# Patient Record
Sex: Male | Born: 1935 | Hispanic: Yes | Smoking: Former smoker
Health system: Southern US, Community
[De-identification: ages and names within clinical notes are randomized; demographics above are authoritative.]

## PROBLEM LIST (undated history)

## (undated) DIAGNOSIS — J9610 Chronic respiratory failure, unspecified whether with hypoxia or hypercapnia: Secondary | ICD-10-CM

## (undated) DIAGNOSIS — M1990 Unspecified osteoarthritis, unspecified site: Secondary | ICD-10-CM

## (undated) DIAGNOSIS — R05 Cough: Secondary | ICD-10-CM

## (undated) DIAGNOSIS — E119 Type 2 diabetes mellitus without complications: Secondary | ICD-10-CM

## (undated) DIAGNOSIS — K5900 Constipation, unspecified: Secondary | ICD-10-CM

## (undated) DIAGNOSIS — J8410 Pulmonary fibrosis, unspecified: Secondary | ICD-10-CM

## (undated) DIAGNOSIS — R059 Cough, unspecified: Secondary | ICD-10-CM

## (undated) DIAGNOSIS — A539 Syphilis, unspecified: Secondary | ICD-10-CM

## (undated) DIAGNOSIS — F0280 Dementia in other diseases classified elsewhere without behavioral disturbance: Secondary | ICD-10-CM

## (undated) DIAGNOSIS — I10 Essential (primary) hypertension: Secondary | ICD-10-CM

## (undated) DIAGNOSIS — J449 Chronic obstructive pulmonary disease, unspecified: Secondary | ICD-10-CM

## (undated) DIAGNOSIS — J189 Pneumonia, unspecified organism: Secondary | ICD-10-CM

## (undated) DIAGNOSIS — G309 Alzheimer's disease, unspecified: Secondary | ICD-10-CM

## (undated) HISTORY — PX: CATARACT EXTRACTION: SUR2

---

## 2015-11-27 ENCOUNTER — Other Ambulatory Visit: Payer: Self-pay

## 2015-11-27 ENCOUNTER — Inpatient Hospital Stay (HOSPITAL_COMMUNITY)
Admission: EM | Admit: 2015-11-27 | Discharge: 2015-11-30 | DRG: 871 | Disposition: A | Discharge status: Skilled Nursing Facility | Payer: Medicare Other | Attending: Internal Medicine | Admitting: Internal Medicine

## 2015-11-27 ENCOUNTER — Encounter (HOSPITAL_COMMUNITY): Payer: Self-pay | Admitting: Emergency Medicine

## 2015-11-27 ENCOUNTER — Emergency Department (HOSPITAL_COMMUNITY): Payer: Medicare Other

## 2015-11-27 DIAGNOSIS — M1990 Unspecified osteoarthritis, unspecified site: Secondary | ICD-10-CM | POA: Diagnosis present

## 2015-11-27 DIAGNOSIS — E43 Unspecified severe protein-calorie malnutrition: Secondary | ICD-10-CM | POA: Diagnosis present

## 2015-11-27 DIAGNOSIS — L8990 Pressure ulcer of unspecified site, unspecified stage: Secondary | ICD-10-CM | POA: Diagnosis present

## 2015-11-27 DIAGNOSIS — L89151 Pressure ulcer of sacral region, stage 1: Secondary | ICD-10-CM | POA: Diagnosis present

## 2015-11-27 DIAGNOSIS — I129 Hypertensive chronic kidney disease with stage 1 through stage 4 chronic kidney disease, or unspecified chronic kidney disease: Secondary | ICD-10-CM | POA: Diagnosis present

## 2015-11-27 DIAGNOSIS — G309 Alzheimer's disease, unspecified: Secondary | ICD-10-CM | POA: Diagnosis present

## 2015-11-27 DIAGNOSIS — J189 Pneumonia, unspecified organism: Secondary | ICD-10-CM | POA: Diagnosis present

## 2015-11-27 DIAGNOSIS — N179 Acute kidney failure, unspecified: Secondary | ICD-10-CM | POA: Diagnosis present

## 2015-11-27 DIAGNOSIS — R627 Adult failure to thrive: Secondary | ICD-10-CM | POA: Diagnosis present

## 2015-11-27 DIAGNOSIS — R778 Other specified abnormalities of plasma proteins: Secondary | ICD-10-CM | POA: Diagnosis present

## 2015-11-27 DIAGNOSIS — F0280 Dementia in other diseases classified elsewhere without behavioral disturbance: Secondary | ICD-10-CM | POA: Diagnosis present

## 2015-11-27 DIAGNOSIS — J440 Chronic obstructive pulmonary disease with acute lower respiratory infection: Secondary | ICD-10-CM | POA: Diagnosis present

## 2015-11-27 DIAGNOSIS — R4182 Altered mental status, unspecified: Secondary | ICD-10-CM | POA: Diagnosis present

## 2015-11-27 DIAGNOSIS — J9611 Chronic respiratory failure with hypoxia: Secondary | ICD-10-CM | POA: Diagnosis present

## 2015-11-27 DIAGNOSIS — Z7401 Bed confinement status: Secondary | ICD-10-CM | POA: Diagnosis not present

## 2015-11-27 DIAGNOSIS — R7989 Other specified abnormal findings of blood chemistry: Secondary | ICD-10-CM | POA: Diagnosis present

## 2015-11-27 DIAGNOSIS — Z9981 Dependence on supplemental oxygen: Secondary | ICD-10-CM

## 2015-11-27 DIAGNOSIS — Y95 Nosocomial condition: Secondary | ICD-10-CM | POA: Diagnosis present

## 2015-11-27 DIAGNOSIS — J9621 Acute and chronic respiratory failure with hypoxia: Secondary | ICD-10-CM | POA: Diagnosis present

## 2015-11-27 DIAGNOSIS — A419 Sepsis, unspecified organism: Principal | ICD-10-CM | POA: Diagnosis present

## 2015-11-27 DIAGNOSIS — Z7952 Long term (current) use of systemic steroids: Secondary | ICD-10-CM

## 2015-11-27 DIAGNOSIS — Z794 Long term (current) use of insulin: Secondary | ICD-10-CM

## 2015-11-27 DIAGNOSIS — R0602 Shortness of breath: Secondary | ICD-10-CM

## 2015-11-27 DIAGNOSIS — E119 Type 2 diabetes mellitus without complications: Secondary | ICD-10-CM

## 2015-11-27 DIAGNOSIS — E1310 Other specified diabetes mellitus with ketoacidosis without coma: Secondary | ICD-10-CM | POA: Diagnosis present

## 2015-11-27 DIAGNOSIS — J441 Chronic obstructive pulmonary disease with (acute) exacerbation: Secondary | ICD-10-CM | POA: Diagnosis present

## 2015-11-27 DIAGNOSIS — E872 Acidosis, unspecified: Secondary | ICD-10-CM

## 2015-11-27 DIAGNOSIS — R6520 Severe sepsis without septic shock: Secondary | ICD-10-CM | POA: Diagnosis present

## 2015-11-27 DIAGNOSIS — R0600 Dyspnea, unspecified: Secondary | ICD-10-CM | POA: Diagnosis not present

## 2015-11-27 DIAGNOSIS — I248 Other forms of acute ischemic heart disease: Secondary | ICD-10-CM | POA: Diagnosis present

## 2015-11-27 DIAGNOSIS — J449 Chronic obstructive pulmonary disease, unspecified: Secondary | ICD-10-CM | POA: Diagnosis not present

## 2015-11-27 DIAGNOSIS — J8410 Pulmonary fibrosis, unspecified: Secondary | ICD-10-CM | POA: Diagnosis present

## 2015-11-27 DIAGNOSIS — I10 Essential (primary) hypertension: Secondary | ICD-10-CM | POA: Diagnosis present

## 2015-11-27 HISTORY — DX: Type 2 diabetes mellitus without complications: E11.9

## 2015-11-27 HISTORY — DX: Essential (primary) hypertension: I10

## 2015-11-27 HISTORY — DX: Constipation, unspecified: K59.00

## 2015-11-27 HISTORY — DX: Dementia in other diseases classified elsewhere, unspecified severity, without behavioral disturbance, psychotic disturbance, mood disturbance, and anxiety: F02.80

## 2015-11-27 HISTORY — DX: Syphilis, unspecified: A53.9

## 2015-11-27 HISTORY — DX: Cough, unspecified: R05.9

## 2015-11-27 HISTORY — DX: Cough: R05

## 2015-11-27 HISTORY — DX: Unspecified osteoarthritis, unspecified site: M19.90

## 2015-11-27 HISTORY — DX: Chronic obstructive pulmonary disease, unspecified: J44.9

## 2015-11-27 HISTORY — DX: Alzheimer's disease, unspecified: G30.9

## 2015-11-27 HISTORY — DX: Pulmonary fibrosis, unspecified: J84.10

## 2015-11-27 LAB — URINALYSIS, ROUTINE W REFLEX MICROSCOPIC
Bilirubin Urine: NEGATIVE
Glucose, UA: NEGATIVE mg/dL
Ketones, ur: NEGATIVE mg/dL
LEUKOCYTES UA: NEGATIVE
NITRITE: NEGATIVE
PROTEIN: 30 mg/dL — AB
SPECIFIC GRAVITY, URINE: 1.016 (ref 1.005–1.030)
pH: 5.5 (ref 5.0–8.0)

## 2015-11-27 LAB — COMPREHENSIVE METABOLIC PANEL
ALT: 27 U/L (ref 17–63)
AST: 43 U/L — AB (ref 15–41)
Albumin: 2.3 g/dL — ABNORMAL LOW (ref 3.5–5.0)
Alkaline Phosphatase: 96 U/L (ref 38–126)
Anion gap: 15 (ref 5–15)
BILIRUBIN TOTAL: 0.7 mg/dL (ref 0.3–1.2)
BUN: 53 mg/dL — AB (ref 6–20)
CHLORIDE: 102 mmol/L (ref 101–111)
CO2: 20 mmol/L — ABNORMAL LOW (ref 22–32)
CREATININE: 3.95 mg/dL — AB (ref 0.61–1.24)
Calcium: 8.8 mg/dL — ABNORMAL LOW (ref 8.9–10.3)
GFR calc Af Amer: 15 mL/min — ABNORMAL LOW (ref >60)
GFR, EST NON AFRICAN AMERICAN: 13 mL/min — AB (ref >60)
GLUCOSE: 302 mg/dL — AB (ref 65–99)
Potassium: 4 mmol/L (ref 3.5–5.1)
Sodium: 137 mmol/L (ref 135–145)
TOTAL PROTEIN: 7.6 g/dL (ref 6.5–8.1)

## 2015-11-27 LAB — I-STAT TROPONIN, ED: Troponin i, poc: 0.28 ng/mL (ref 0.00–0.08)

## 2015-11-27 LAB — URINE MICROSCOPIC-ADD ON

## 2015-11-27 LAB — CBC WITH DIFFERENTIAL/PLATELET
Basophils Absolute: 0.2 10*3/uL — ABNORMAL HIGH (ref 0.0–0.1)
Basophils Relative: 1 %
EOS PCT: 0 %
Eosinophils Absolute: 0 10*3/uL (ref 0.0–0.7)
HCT: 39.2 % (ref 39.0–52.0)
Hemoglobin: 12.6 g/dL — ABNORMAL LOW (ref 13.0–17.0)
LYMPHS ABS: 2 10*3/uL (ref 0.7–4.0)
Lymphocytes Relative: 11 %
MCH: 26.8 pg (ref 26.0–34.0)
MCHC: 32.1 g/dL (ref 30.0–36.0)
MCV: 83.4 fL (ref 78.0–100.0)
MONO ABS: 0.7 10*3/uL (ref 0.1–1.0)
Monocytes Relative: 4 %
NEUTROS ABS: 15 10*3/uL — AB (ref 1.7–7.7)
Neutrophils Relative %: 84 %
PLATELETS: 280 10*3/uL (ref 150–400)
RBC: 4.7 MIL/uL (ref 4.22–5.81)
RDW: 13.2 % (ref 11.5–15.5)
WBC: 17.9 10*3/uL — AB (ref 4.0–10.5)

## 2015-11-27 LAB — AMMONIA: AMMONIA: 29 umol/L (ref 9–35)

## 2015-11-27 LAB — I-STAT CG4 LACTIC ACID, ED
Lactic Acid, Venous: 6.09 mmol/L (ref 0.5–2.0)
Lactic Acid, Venous: 3.12 mmol/L (ref 0.5–2.0)

## 2015-11-27 LAB — I-STAT VENOUS BLOOD GAS, ED
ACID-BASE DEFICIT: 6 mmol/L — AB (ref 0.0–2.0)
BICARBONATE: 19.2 meq/L — AB (ref 20.0–24.0)
O2 SAT: 57 %
TCO2: 20 mmol/L (ref 0–100)
pCO2, Ven: 34.7 mmHg — ABNORMAL LOW (ref 45.0–50.0)
pH, Ven: 7.35 — ABNORMAL HIGH (ref 7.250–7.300)
pO2, Ven: 31 mmHg (ref 31.0–45.0)

## 2015-11-27 LAB — TROPONIN I: TROPONIN I: 0.3 ng/mL — AB (ref <0.031)

## 2015-11-27 LAB — LACTIC ACID, PLASMA: Lactic Acid, Venous: 2.2 mmol/L (ref 0.5–2.0)

## 2015-11-27 MED ORDER — SODIUM CHLORIDE 0.9 % IV SOLN
INTRAVENOUS | Status: DC
  Administered 2015-11-27 – 2015-11-28 (×2): via INTRAVENOUS

## 2015-11-27 MED ORDER — ALBUTEROL SULFATE (2.5 MG/3ML) 0.083% IN NEBU
2.5000 mg | INHALATION_SOLUTION | Freq: Four times a day (QID) | RESPIRATORY_TRACT | Status: DC
Start: 2015-11-27 — End: 2015-11-28
  Administered 2015-11-28 (×3): 2.5 mg via RESPIRATORY_TRACT
  Filled 2015-11-27 (×2): qty 3

## 2015-11-27 MED ORDER — AZITHROMYCIN 500 MG IV SOLR
250.0000 mg | INTRAVENOUS | Status: DC
  Administered 2015-11-28 (×2): 250 mg via INTRAVENOUS
  Filled 2015-11-27 (×2): qty 250

## 2015-11-27 MED ORDER — INSULIN ASPART 100 UNIT/ML ~~LOC~~ SOLN
0.0000 [IU] | Freq: Three times a day (TID) | SUBCUTANEOUS | Status: DC
  Administered 2015-11-28: 3 [IU] via SUBCUTANEOUS
  Administered 2015-11-28: 5 [IU] via SUBCUTANEOUS

## 2015-11-27 MED ORDER — VANCOMYCIN HCL IN DEXTROSE 1-5 GM/200ML-% IV SOLN
1000.0000 mg | Freq: Once | INTRAVENOUS | Status: AC
  Administered 2015-11-27: 1000 mg via INTRAVENOUS
  Filled 2015-11-27: qty 200

## 2015-11-27 MED ORDER — BUDESONIDE 0.5 MG/2ML IN SUSP
0.5000 mg | Freq: Two times a day (BID) | RESPIRATORY_TRACT | Status: DC
  Administered 2015-11-28 – 2015-11-30 (×5): 0.5 mg via RESPIRATORY_TRACT
  Filled 2015-11-27 (×6): qty 2

## 2015-11-27 MED ORDER — ALBUTEROL SULFATE (2.5 MG/3ML) 0.083% IN NEBU: 2.5000 mg | INHALATION_SOLUTION | RESPIRATORY_TRACT | Status: DC | PRN

## 2015-11-27 MED ORDER — INSULIN ASPART 100 UNIT/ML ~~LOC~~ SOLN
0.0000 [IU] | Freq: Every day | SUBCUTANEOUS | Status: DC
Start: 2015-11-27 — End: 2015-11-28
  Administered 2015-11-28: 4 [IU] via SUBCUTANEOUS

## 2015-11-27 MED ORDER — DEXTROSE 5 % IV SOLN
1.0000 g | INTRAVENOUS | Status: DC
  Administered 2015-11-28 – 2015-11-29 (×2): 1 g via INTRAVENOUS
  Filled 2015-11-27 (×2): qty 1

## 2015-11-27 MED ORDER — ASPIRIN 300 MG RE SUPP: 300.0000 mg | Freq: Once | RECTAL | Status: DC

## 2015-11-27 MED ORDER — VANCOMYCIN HCL IN DEXTROSE 750-5 MG/150ML-% IV SOLN: 750.0000 mg | INTRAVENOUS | Status: DC

## 2015-11-27 MED ORDER — PREDNISONE 5 MG PO TABS
15.0000 mg | ORAL_TABLET | Freq: Every day | ORAL | Status: DC
  Administered 2015-11-28 – 2015-11-29 (×2): 15 mg via ORAL
  Filled 2015-11-27: qty 1
  Filled 2015-11-27: qty 2

## 2015-11-27 MED ORDER — SODIUM CHLORIDE 0.9% FLUSH
3.0000 mL | Freq: Two times a day (BID) | INTRAVENOUS | Status: DC
  Administered 2015-11-28 – 2015-11-30 (×6): 3 mL via INTRAVENOUS

## 2015-11-27 MED ORDER — SODIUM CHLORIDE 0.9 % IV BOLUS (SEPSIS): 1000.0000 mL | Freq: Once | INTRAVENOUS | Status: DC

## 2015-11-27 MED ORDER — ATORVASTATIN CALCIUM 40 MG PO TABS
40.0000 mg | ORAL_TABLET | Freq: Every day | ORAL | Status: DC
Start: 2015-11-28 — End: 2015-11-30
  Administered 2015-11-28 – 2015-11-30 (×3): 40 mg via ORAL
  Filled 2015-11-27 (×3): qty 1

## 2015-11-27 MED ORDER — PIPERACILLIN-TAZOBACTAM IN DEX 2-0.25 GM/50ML IV SOLN
2.2500 g | Freq: Three times a day (TID) | INTRAVENOUS | Status: DC
  Filled 2015-11-27: qty 50

## 2015-11-27 MED ORDER — ACETAMINOPHEN 650 MG RE SUPP: 650.0000 mg | Freq: Four times a day (QID) | RECTAL | Status: DC | PRN

## 2015-11-27 MED ORDER — SODIUM CHLORIDE 0.9 % IV BOLUS (SEPSIS)
30.0000 mL/kg | Freq: Once | INTRAVENOUS | Status: AC
  Administered 2015-11-27: 2100 mL via INTRAVENOUS

## 2015-11-27 MED ORDER — IPRATROPIUM-ALBUTEROL 0.5-2.5 (3) MG/3ML IN SOLN: 3.0000 mL | Freq: Four times a day (QID) | RESPIRATORY_TRACT | Status: DC

## 2015-11-27 MED ORDER — GLUCERNA SHAKE PO LIQD
237.0000 mL | Freq: Every evening | ORAL | Status: DC
  Administered 2015-11-28 – 2015-11-29 (×2): 237 mL via ORAL

## 2015-11-27 MED ORDER — ASPIRIN 81 MG PO CHEW
324.0000 mg | CHEWABLE_TABLET | Freq: Once | ORAL | Status: AC
  Administered 2015-11-27: 324 mg via ORAL
  Filled 2015-11-27: qty 4

## 2015-11-27 MED ORDER — PIPERACILLIN-TAZOBACTAM 3.375 G IVPB 30 MIN
3.3750 g | Freq: Once | INTRAVENOUS | Status: AC
  Administered 2015-11-27: 3.375 g via INTRAVENOUS
  Filled 2015-11-27: qty 50

## 2015-11-27 MED ORDER — ACETAMINOPHEN 325 MG PO TABS
650.0000 mg | ORAL_TABLET | Freq: Four times a day (QID) | ORAL | Status: DC | PRN
  Administered 2015-11-29: 650 mg via ORAL
  Filled 2015-11-27: qty 2

## 2015-11-27 MED ORDER — HEPARIN SODIUM (PORCINE) 5000 UNIT/ML IJ SOLN
5000.0000 [IU] | Freq: Three times a day (TID) | INTRAMUSCULAR | Status: DC
  Administered 2015-11-28 – 2015-11-30 (×9): 5000 [IU] via SUBCUTANEOUS
  Filled 2015-11-27 (×8): qty 1

## 2015-11-27 MED ORDER — PIPERACILLIN-TAZOBACTAM 3.375 G IVPB: 3.3750 g | Freq: Three times a day (TID) | INTRAVENOUS | Status: DC

## 2015-11-27 NOTE — Progress Notes (Addendum)
ANTIBIOTIC CONSULT NOTE - INITIAL  Pharmacy Consult for vancomycin and Cefepime Indication: sepsis  No Known Allergies  Patient Measurements:  Vital Signs: BP: 127/63 mmHg (06/25 1830) Pulse Rate: 99 (06/25 1830) Intake/Output from previous day:   Intake/Output from this shift: Total I/O In: 50 [I.V.:50] Out: -   Labs:  Recent Labs  11/27/15 1755  WBC 17.9*  HGB 12.6*  PLT 280  CREATININE 3.95*   CrCl cannot be calculated (Unknown ideal weight.). No results for input(s): VANCOTROUGH, VANCOPEAK, VANCORANDOM, GENTTROUGH, GENTPEAK, GENTRANDOM, TOBRATROUGH, TOBRAPEAK, TOBRARND, AMIKACINPEAK, AMIKACINTROU, AMIKACIN in the last 72 hours.   Microbiology: No results found for this or any previous visit (from the past 720 hour(s)).  Medical History: Past Medical History  Diagnosis Date  . COPD (chronic obstructive pulmonary disease) (HCC)   . Diabetes mellitus without complication (HCC)   . Syphilis   . Alzheimer disease   . Pulmonary fibrosis (HCC)   . Osteoarthritis   . Cough   . Hypertension   . Constipation    Assessment: 81 year [facility] status.  No fevers noted. Baseline labs sx for elevated SCr of 3.95, WBC of 17.9 and lactic acid of 6.  Goal of Therapy:  Vancomycin trough level 15-20 mcg/ml  Plan:  1. Vancomycin 1000 mg x 1 received earlier followed by vancomycin 750 mg Q48 hours starting on 6/27 at 18:00 2. Cefepime 1 gram IV q 24 hours  3. F/u PMHx and renal function   Pollyann SamplesAndy Merik Mignano, PharmD, BCPS 11/27/2015, 7:38 PM Pager: [phone]

## 2015-11-27 NOTE — ED Notes (Signed)
Attempted to call report. Floor RN unable to accept report.  

## 2015-11-27 NOTE — ED Notes (Signed)
Patient transported to CT 

## 2015-11-27 NOTE — ED Provider Notes (Signed)
The patient is essentially bedbound 81 year old male who baseline according to the paramedics is alert and oriented and able to commit [place]. Apparently he had had lunch but afterwards had a fairly steep decline to the point where he was obtunded prior to EMS arrival. He was found to be severely hypotensive, he was also hypoxic but is on 3 L of nasal cannula oxygen secondary to likely underlying interstitial lung disease. The patient is unable to give any information. He does wake up to painful stimuli and loud voice, he is unable to follow much in the way of commands except he did grip my hand with his right hand. He did not do this with the left. He does not move either leg. He does open both of his eyes and when he grimaces he does not have any facial droop. He was not tachycardic on my exam but did have a hypotension, his abdomen was soft and nontender, he has diffuse rales which I suspect is related to pulmonary fibrosis.  Due to the patient's profound hypotension prehospital he will need fluid resuscitation likely admission to the hospital as the source of his altered mental status could be either infectious or cardiac as he does have diffuse T-wave abnormalities and some ST of modalities  Labwork showed elevated lactic acid as well as elevated troponin, the patient initially appeared very ill with severe hypotension. He did respond to both IV fluid resuscitation and antibiotics. The patient was critically with what appears to be sepsis, likely from pneumonia, on top of that myocardial infarction which was likely reactive to the underlying infection  CRITICAL CARE Performed by: Vida RollerBrian D Flemon Kelty Total critical care time: 35 minutes Critical care time was exclusive of separately billable procedures and treating other patients. Critical care was necessary to treat or prevent imminent or life-threatening deterioration. Critical care was time spent personally by me on the following activities:  development of treatment plan with patient and/or surrogate as well as nursing, discussions with consultants, evaluation of patient's response to treatment, examination of patient, obtaining history from patient or surrogate, ordering and performing treatments and interventions, ordering and review of laboratory studies, ordering and review of radiographic studies, pulse oximetry and re-evaluation of patient's condition.   I saw and evaluated the patient, reviewed the resident's note and I agree with the findings and plan.   EKG Interpretation  Date/Time:  Sunday November 27 2015 17:58:38 EDT Ventricular Rate:  99 PR Interval:    QRS Duration: 92 QT Interval:  376 QTC Calculation: 483 R Axis:   -23 Text Interpretation:  Sinus rhythm Probable left atrial enlargement Borderline left axis deviation Abnormal T, consider ischemia, anterior leads No old tracing to compare Abnormal ekg Confirmed by Hyacinth MeekerMILLER  MD, Maicie Vanderloop ([id number]) on 11/27/2015 6:12:17 PM       I personally interpreted the EKG as well as the resident and agree with the interpretation on the resident's chart.  Final diagnoses:  SOB (shortness of breath)      Eber HongBrian Dionis Autry, MD 11/29/15 1112

## 2015-11-27 NOTE — ED Notes (Signed)
Pt here from [facility]- staff reprots pt was "sleep" at lunchtime but awake enough to eat. EMS reports that staff later found pt only responsive to pain. Pt arrived responsive to pain. At baseline pt is a/o x 4. Pt received 1000ml NS PTA.

## 2015-11-27 NOTE — Progress Notes (Signed)
eLink Physician-Brief Progress Note Patient Name: Steven SchwabJuan Martin Longwell DOB: 02-28-1935 MRN: [id number]   Date of Service  11/27/2015  HPI/Events of Note  New patient admitted to stepdown with known underlying dementia as well as interstitial lung disease. Presented with decreased interaction today. Baseline has chronic hypoxic respiratory failure. Etiology for ILD is unknown. Patient is febrile with leukocytosis. Suspect underlying healthcare associated pneumonia as he lives at a skilled nursing facility. Patient did have transient hypotension with lactic acidosis consistent with underlying sepsis. Lactic acidosis improving. Hypotension resolved after bolus IV fluid. Discussed case with hospitalist at bedside. Recheck on patient shows patient resting comfortably on nasal cannula oxygen. Normotensive and not tachycardic.   eICU Interventions  1. Urine streptococcal & Legionella antigens pending 2. Continuing broad-spectrum antibiotic coverage with vancomycin & cefepime. 3. Adding a azithromycin for atypical coverage 4. Trending Procalcitonin per algorithm 5. Consider investigation for flare of interstitial lung disease depending upon Procalcitonin and other clinical risk factors 6. Patient to be assessed by pulmonary service in the morning      Intervention Category Evaluation Type: New Patient Evaluation  Lawanda CousinsJennings Thadius Smisek 11/27/2015, 11:34 PM

## 2015-11-27 NOTE — ED Provider Notes (Signed)
CSN: [id number]     Arrival date & time 11/27/15  1745 History   First MD Initiated Contact with Patient 11/27/15 1746     Chief Complaint  Patient presents with  . Altered Mental Status   Patient is a 81 y.o. male presenting with altered mental status.  Altered Mental Status Associated symptoms: no abdominal pain, no fever, no nausea and no vomiting    Pt here from [facility]- staff reprots pt was "sleep" at lunchtime but awake enough to eat. EMS reports that staff later found pt only responsive to pain. Pt arrived responsive to pain. At baseline pt is a/o x 4. Pt received NS PTA.   Spoke with Sue Lush, states he has a chronic cough. Given insulin prior to dc because sugar was in 300s. Lately has been incontinent since yesterday. Now confused since yesterday last night, today was diaphoretic and lethargic. No fevers - 99 Tmax axillary but he was sweating a lot.   No recent falls, unilateral weakness,  History limited due to patient's mental condition.  Patient was hypotensive upon EMS arrival with systolics in the 80s.  Past Medical History  Diagnosis Date  . COPD (chronic obstructive pulmonary disease) (HCC)   . Diabetes mellitus without complication (HCC)   . Syphilis   . Alzheimer disease   . Pulmonary fibrosis (HCC)   . Osteoarthritis   . Cough   . Hypertension   . Constipation    Past Surgical History  Procedure Laterality Date  . Cataract extraction     History reviewed. No pertinent family history. Social History  Substance Use Topics  . Smoking status: Unknown If Ever Smoked  . Smokeless tobacco: None  . Alcohol Use: No    Review of Systems  Unable to perform ROS: Mental status change  Constitutional: Negative for fever.  Cardiovascular: Negative for chest pain.  Gastrointestinal: Positive for diarrhea (minimal). Negative for nausea, vomiting and abdominal pain.  Allergic/Immunologic: Negative for immunocompromised state (prednisone 5mg ).       Allergies  Review of patient's allergies indicates no known allergies.  Home Medications   Prior to Admission medications   Medication Sig Start Date End Date Taking? Authorizing Provider  acetaminophen (TYLENOL) 325 MG tablet Take 650 mg by mouth every 6 (six) hours as needed for fever.   Yes Historical Provider, MD  atorvastatin (LIPITOR) 40 MG tablet Take 40 mg by mouth every morning.   Yes Historical Provider, MD  Bromfenac Sodium (PROLENSA) 0.07 % SOLN Place 1 drop into the left eye every morning. For 14 days, ending 12-04-15 11/20/15 12/04/15 Yes Historical Provider, MD  budesonide (PULMICORT) 0.5 MG/2ML nebulizer solution Take 0.5 mg by nebulization every 12 (twelve) hours. 2 ml inhale orally every 12 hours related to COPD exacerbation.  Rinse mouth with water.  Do not swallow.   Yes Historical Provider, MD  cetirizine (ZYRTEC) 10 MG tablet Take 10 mg by mouth every morning.   Yes Historical Provider, MD  Dextromethorphan HBr 15 MG CAPS Take 15 mg by mouth every 6 (six) hours as needed (for cough).   Yes Historical Provider, MD  dextromethorphan-guaiFENesin (ROBITUSSIN-DM) 10-100 MG/5ML liquid Take 10 mLs by mouth every 6 (six) hours as needed for cough.   Yes Historical Provider, MD  Difluprednate (DUREZOL) 0.05 % EMUL Place 1 drop into the left eye every morning. For 7 days ending 11-28-15 11/20/15 11/28/15 Yes Historical Provider, MD  feeding supplement, GLUCERNA SHAKE, (GLUCERNA SHAKE) LIQD Take 237 mLs by mouth every evening.  Strawberry   Yes Historical Provider, MD  gabapentin (NEURONTIN) 100 MG capsule Take 100 mg by mouth 3 (three) times daily.   Yes Historical Provider, MD  glucagon (GLUCAGON EMERGENCY) 1 MG injection Inject 1 mg into the vein once as needed (for hypoglycemia (BS <60 mg/dl) if patient unable to swallow or unconscious).   Yes Historical Provider, MD  insulin aspart (NOVOLOG FLEXPEN) 100 UNIT/ML FlexPen Inject 0-10 Units into the skin 4 (four) times daily -   before meals and at bedtime. 201 - 250 = 2 units 251 - 300 = 4 units 301 - 350 = 6 units 351 - 400 = 8 units If BS < 60, call MD If BS > 400, give 10 units and call MD   Yes Historical Provider, MD  ipratropium-albuterol (DUONEB) 0.5-2.5 (3) MG/3ML SOLN Take 3 mLs by nebulization every 6 (six) hours. 3 ml inhale orally every 6 hours related to COPD exacerbation.  Rinse mouth with water.  Do not swallow.   Yes Historical Provider, MD  losartan (COZAAR) 100 MG tablet Take 100 mg by mouth every morning.   Yes Historical Provider, MD  metFORMIN (GLUCOPHAGE) 500 MG tablet Take 500 mg by mouth 2 (two) times daily with a meal.   Yes Historical Provider, MD  Multiple Vitamin (MULTIVITAMIN) tablet Take 1 tablet by mouth every morning.   Yes Historical Provider, MD  oxybutynin (DITROPAN-XL) 10 MG 24 hr tablet Take 10 mg by mouth every morning.   Yes Historical Provider, MD  OXYGEN Inhale 2 L into the lungs continuous.   Yes Historical Provider, MD  predniSONE (DELTASONE) 5 MG tablet Take 5 mg by mouth daily with breakfast.   Yes Historical Provider, MD  sennosides-docusate sodium (SENOKOT-S) 8.6-50 MG tablet Take 1 tablet by mouth 2 (two) times daily.   Yes Historical Provider, MD  sorbitol 70 % solution Take 30 mLs by mouth daily as needed (for constipation).   Yes Historical Provider, MD   BP 127/63 mmHg  Pulse 99  Resp 23  Wt 51.256 kg  SpO2 95% Physical Exam  Constitutional: He appears well-developed and well-nourished. No distress.  HENT:  Head: Normocephalic and atraumatic.  Left Ear: External ear normal.  Eyes: Conjunctivae are normal. Pupils are equal, round, and reactive to light. Right eye exhibits no discharge. Left eye exhibits no discharge.  Neck: Normal range of motion. Neck supple.  Cardiovascular: Normal rate and regular rhythm.   No murmur heard. Pulmonary/Chest: Effort normal. No respiratory distress. He has rales (bilateral).  Abdominal: Soft. Bowel sounds are normal. He  exhibits no distension and no mass. There is no tenderness. There is no rebound and no guarding.  Musculoskeletal: He exhibits no edema.  Neurological:  Somnolent but wakes up to Spanish, moans and groans but does not verbalize any words Follows commands in Spanish  Skin: Skin is warm. He is not diaphoretic.  Psychiatric: He has a normal mood and affect.  Nursing note and vitals reviewed.   ED Course  Procedures (including critical care time) Labs Review Labs Reviewed  COMPREHENSIVE METABOLIC PANEL - Abnormal; Notable for the following:    CO2 20 (*)    Glucose, Bld 302 (*)    BUN 53 (*)    Creatinine, Ser 3.95 (*)    Calcium 8.8 (*)    Albumin 2.3 (*)    AST 43 (*)    GFR calc non Af Amer 13 (*)    GFR calc Af Amer 15 (*)    All  other components within normal limits  CBC WITH DIFFERENTIAL/PLATELET - Abnormal; Notable for the following:    WBC 17.9 (*)    Hemoglobin 12.6 (*)    Neutro Abs 15.0 (*)    Basophils Absolute 0.2 (*)    All other components within normal limits  I-STAT CG4 LACTIC ACID, ED - Abnormal; Notable for the following:    Lactic Acid, Venous 6.09 (*)    All other components within normal limits  I-STAT VENOUS BLOOD GAS, ED - Abnormal; Notable for the following:    pH, Ven 7.350 (*)    pCO2, Ven 34.7 (*)    Bicarbonate 19.2 (*)    Acid-base deficit 6.0 (*)    All other components within normal limits  I-STAT TROPOININ, ED - Abnormal; Notable for the following:    Troponin i, poc 0.28 (*)    All other components within normal limits  CULTURE, BLOOD (ROUTINE X 2)  CULTURE, BLOOD (ROUTINE X 2)  URINE CULTURE  AMMONIA  URINALYSIS, ROUTINE W REFLEX MICROSCOPIC (NOT AT [facility])  BLOOD GAS, VENOUS    Imaging Review Ct Head Wo Contrast  11/27/2015  CLINICAL DATA:  Acute mental status changes. EXAM: CT HEAD WITHOUT CONTRAST TECHNIQUE: Contiguous axial images were obtained from the base of the skull through the vertex without intravenous contrast. COMPARISON:   None. FINDINGS: Age related cerebral atrophy, ventriculomegaly and periventricular white matter disease. No acute intracranial findings such as hemispheric infarction an or intracranial hemorrhage. No mass lesions are identified. No extra-axial fluid collections. The brainstem and cerebellum are grossly normal. The bony structures are intact. The paranasal sinuses and mastoid air cells are clear. The globes are intact. IMPRESSION: Cerebral atrophy, ventriculomegaly and periventricular white matter disease but no acute intracranial findings or mass lesions. Electronically Signed   By: Rudie Meyer M.D.   On: 11/27/2015 19:10   Dg Chest Port 1 View  11/27/2015  CLINICAL DATA:  81 year old male with altered mental status EXAM: PORTABLE CHEST 1 VIEW COMPARISON:  None. FINDINGS: Single portable view of the chest demonstrates emphysematous changes of the lungs with bronchiectatic changes. Patchy airspace density at the left lung base as well as in the right upper lobe scar concerning for developing pneumonia. Clinical correlation and follow-up recommended. There is no pleural effusion or pneumothorax. The cardiac silhouette is within normal limits. No acute osseous pathology. IMPRESSION: Left lung base and right upper lobe airspace densities concerning for pneumonia. Follow-up recommended. Electronically Signed   By: Elgie Collard M.D.   On: 11/27/2015 18:27   I have personally reviewed and evaluated these images and lab results as part of my medical decision-making.   EKG Interpretation   Date/Time:  Sunday November 27 2015 17:58:38 EDT Ventricular Rate:  99 PR Interval:    QRS Duration: 92 QT Interval:  376 QTC Calculation: 483 R Axis:   -23 Text Interpretation:  Sinus rhythm Probable left atrial enlargement  Borderline left axis deviation Abnormal T, consider ischemia, anterior  leads No old tracing to compare Abnormal ekg Confirmed by Hyacinth Meeker  MD,  BRIAN ([id number]) on 11/27/2015 6:12:17 PM      MDM    Final diagnoses:  Elevated troponin  Lactic acidosis  HCAP (healthcare-associated pneumonia)  Altered mental status, unspecified altered mental status type  AKI (acute kidney injury) (HCC)    Troponin is elevated with T- wave inversions in anteroseptal leads, however, patient denies chest pain. Likely secondary to demand ischemia. Aspirin administered. Page cardiology but have not heard back. Doubt  ACS. We'll continue to trend and hold from anticoagulation.   Otherwise, appears to have a pneumonia, pending urinalysis. Treating with broad-spectrum antibiotics and has received 30 mL/kg. Blood pressure has improved, lactic acid initially highly elevated but pending repeat. Hemodynamically stable now on home oxygen. Patient feels remarkably better and is now joking with family and much more alert. Blood pressure is normal. Will admit to stepdown unit.    Sidney AceAlison Charruf Demontez Novack, MD 11/27/15 2033  Eber HongBrian Miller, MD 11/29/15 [phone]

## 2015-11-27 NOTE — H&P (Signed)
History and Physical  Patient Name: Steven Pope     ZOX:[id number]    DOB: 1935/01/16    DOA: 11/27/2015 PCP: [name], MD   Patient coming from: [facility], previously [facility] in [place], and [place]  Chief Complaint: Unresponsive  HPI: [name] is a 81 y.o. male with a past medical history significant for , COPD on home O2, ILD NOS, NIDDM, syphilis?, severe PCM/FTT and HTN who presents with unresponsiveness.  Caveat that patient is altered and can provide no history for himself.  He has dementia and is bedbound at baseline but is interactive per NH report.  Today, however, after lunch, he was noted to have decreased responsiveness.  Staff checked his BP and found he had BP 60/40s and CBG 350 and so 9-1-1 was called.  EMS found him and on arrival to the ED he was obtunded, grimacing to loud voice or painful stimuli and hypotensive.  ED course: -Afebrile, tachycardic, tachypneic, requiring more than normal supplemental O2 by [place] -Na 137, K 4.0, Cr 3.95 (baseline unknown, CKD not in problem list), WBC 17.9K, Hgb 12 -Lactate 6.09 mmol/L -Troponin 0.28 with ECG showing inverted TW in anterior leads, no previous for comparison, patient later waking to deny CP -CT head normal and no focal weakness -CXR with multifocal opacities, and so culture data were collected, 30 cc/kg bolus administered and vanc/Zosyn for sepsis presumed pneumonia -Case was discussed with Cardiology and PCCM.  BP responded quickly to fluids and so TRH were asked to evaluate for admission     Review of Systems:  Unable to obtain reliable ROS due to patient mentation, but patient endorsing cough, dyspnea, denies chest pain.    Past Medical History  Diagnosis Date  . COPD (chronic obstructive pulmonary disease) (HCC)   . Diabetes mellitus without complication (HCC)   . Syphilis   . Alzheimer disease   . Pulmonary fibrosis (HCC)   . Osteoarthritis   . Cough   . Hypertension   .  Constipation     Past Surgical History  Procedure Laterality Date  . Cataract extraction      Social History: Patient lives at [facility].  He is from [place] before that.  The patient does not walk per report.  Sons are POA, information listed below.    No Known Allergies  Family history: family history is not on file. Unable to obtain due to patient mentation.  Prior to Admission medications   Medication Sig Start Date End Date Taking? Authorizing Provider  acetaminophen (TYLENOL) 325 MG tablet Take 650 mg by mouth every 6 (six) hours as needed for fever.   Yes Historical Provider, MD  atorvastatin (LIPITOR) 40 MG tablet Take 40 mg by mouth every morning.   Yes Historical Provider, MD  Bromfenac Sodium (PROLENSA) 0.07 % SOLN Place 1 drop into the left eye every morning. For 14 days, ending 12-04-15 11/20/15 12/04/15 Yes Historical Provider, MD  budesonide (PULMICORT) 0.5 MG/2ML nebulizer solution Take 0.5 mg by nebulization every 12 (twelve) hours. 2 ml inhale orally every 12 hours related to COPD exacerbation.  Rinse mouth with water.  Do not swallow.   Yes Historical Provider, MD  cetirizine (ZYRTEC) 10 MG tablet Take 10 mg by mouth every morning.   Yes Historical Provider, MD  Dextromethorphan HBr 15 MG CAPS Take 15 mg by mouth every 6 (six) hours as needed (for cough).   Yes Historical Provider, MD  dextromethorphan-guaiFENesin (ROBITUSSIN-DM) 10-100 MG/5ML liquid Take  10 mLs by mouth every 6 (six) hours as needed for cough.   Yes Historical Provider, MD  Difluprednate (DUREZOL) 0.05 % EMUL Place 1 drop into the left eye every morning. For 7 days ending 11-28-15 11/20/15 11/28/15 Yes Historical Provider, MD  feeding supplement, GLUCERNA SHAKE, (GLUCERNA SHAKE) LIQD Take 237 mLs by mouth every evening. Strawberry   Yes Historical Provider, MD  gabapentin (NEURONTIN) 100 MG capsule Take 100 mg by mouth 3 (three) times daily.   Yes Historical Provider, MD  glucagon (GLUCAGON  EMERGENCY) 1 MG injection Inject 1 mg into the vein once as needed (for hypoglycemia (BS <60 mg/dl) if patient unable to swallow or unconscious).   Yes Historical Provider, MD  insulin aspart (NOVOLOG FLEXPEN) 100 UNIT/ML FlexPen Inject 0-10 Units into the skin 4 (four) times daily -  before meals and at bedtime. 201 - 250 = 2 units 251 - 300 = 4 units 301 - 350 = 6 units 351 - 400 = 8 units If BS < 60, call MD If BS > 400, give 10 units and call MD   Yes Historical Provider, MD  ipratropium-albuterol (DUONEB) 0.5-2.5 (3) MG/3ML SOLN Take 3 mLs by nebulization every 6 (six) hours. 3 ml inhale orally every 6 hours related to COPD exacerbation.  Rinse mouth with water.  Do not swallow.   Yes Historical Provider, MD  losartan (COZAAR) 100 MG tablet Take 100 mg by mouth every morning.   Yes Historical Provider, MD  metFORMIN (GLUCOPHAGE) 500 MG tablet Take 500 mg by mouth 2 (two) times daily with a meal.   Yes Historical Provider, MD  Multiple Vitamin (MULTIVITAMIN) tablet Take 1 tablet by mouth every morning.   Yes Historical Provider, MD  oxybutynin (DITROPAN-XL) 10 MG 24 hr tablet Take 10 mg by mouth every morning.   Yes Historical Provider, MD  OXYGEN Inhale 2 L into the lungs continuous.   Yes Historical Provider, MD  predniSONE (DELTASONE) 5 MG tablet Take 5 mg by mouth daily with breakfast.   Yes Historical Provider, MD  sennosides-docusate sodium (SENOKOT-S) 8.6-50 MG tablet Take 1 tablet by mouth 2 (two) times daily.   Yes Historical Provider, MD  sorbitol 70 % solution Take 30 mLs by mouth daily as needed (for constipation).   Yes Historical Provider, MD       Physical Exam: BP 94/73 mmHg  Pulse 102  Temp(Src) 97.8 F (36.6 C) (Rectal)  Resp 18  Wt 51.256 kg (113 lb)  SpO2 99% General appearance: Emaciated, chronically ill appearing adult male, rousable to voice, and intermittently answering questions, but not oriented (mumbles he is "at [employer]" and "they brought everybody out  here").  No respiratory distress.   Eyes: Anicteric, conjunctiva pink, lids and lashes normal.     ENT: No nasal deformity, discharge, or epistaxis.  OP with dry MM without lesions.   Lymph: No cervical or supraclavicular lymphadenopathy. Skin: Warm and dry.  No mottling.  No suspicious rashes or lesions. Cardiac: Tachycardic, nl S1-S2, no murmurs appreciated.  Capillary refill is brisk.  JVP low.  No LE edema.  Radial and DP pulses 2+ and symmetric. Respiratory: Tachypneic.  No accessory muscle use.  Rales on RIGHT. Abdomen: Abdomen soft without rigidity.  No TTP. No ascites, distension.   MSK: No deformities or effusions. Neuro: Rousable to voice but disoriented.  Cranial nerves not possible to test but appear grossly normal/symmetric.  Speech is mumbled but apears at baseline.  Moves all extremities equally and with normal  coordination, able to sit up, strength testing 5/5 and symmetric in arms and legs.    Psych: Confused.  No evidence of aural or visual hallucinations or delusions.       Labs on Admission:  I have personally reviewed following labs and imaging studies: CBC:  Recent Labs Lab 11/27/15 1755  WBC 17.9*  NEUTROABS 15.0*  HGB 12.6*  HCT 39.2  MCV 83.4  PLT 280   Basic Metabolic Panel:  Recent Labs Lab 11/27/15 1755  NA 137  K 4.0  CL 102  CO2 20*  GLUCOSE 302*  BUN 53*  CREATININE 3.95*  CALCIUM 8.8*   GFR: CrCl cannot be calculated (Unknown ideal weight.).   Liver Function Tests:  Recent Labs Lab 11/27/15 1755  AST 43*  ALT 27  ALKPHOS 96  BILITOT 0.7  PROT 7.6  ALBUMIN 2.3*     Recent Labs Lab 11/27/15 1803  AMMONIA 29    Sepsis Labs: Lactate 6.09        Radiological Exams on Admission: Personally reviewed: Ct Head Wo Contrast  11/27/2015  CLINICAL DATA:  Acute mental status changes. EXAM: CT HEAD WITHOUT CONTRAST TECHNIQUE: Contiguous axial images were obtained from the base of the skull through the vertex without intravenous  contrast. COMPARISON:  None. FINDINGS: Age related cerebral atrophy, ventriculomegaly and periventricular white matter disease. No acute intracranial findings such as hemispheric infarction an or intracranial hemorrhage. No mass lesions are identified. No extra-axial fluid collections. The brainstem and cerebellum are grossly normal. The bony structures are intact. The paranasal sinuses and mastoid air cells are clear. The globes are intact. IMPRESSION: Cerebral atrophy, ventriculomegaly and periventricular white matter disease but no acute intracranial findings or mass lesions. Electronically Signed   By: Rudie MeyerP.  Gallerani M.D.   On: 11/27/2015 19:10   Dg Chest Port 1 View  11/27/2015  CLINICAL DATA:  81 year old male with altered mental status EXAM: PORTABLE CHEST 1 VIEW COMPARISON:  None. FINDINGS: Single portable view of the chest demonstrates emphysematous changes of the lungs with bronchiectatic changes. Patchy airspace density at the left lung base as well as in the right upper lobe scar concerning for developing pneumonia. Clinical correlation and follow-up recommended. There is no pleural effusion or pneumothorax. The cardiac silhouette is within normal limits. No acute osseous pathology. IMPRESSION: Left lung base and right upper lobe airspace densities concerning for pneumonia. Follow-up recommended. Electronically Signed   By: Elgie CollardArash  Radparvar M.D.   On: 11/27/2015 18:27    EKG: Independently reviewed. Rate 99, sinus rhythm.  QTc 483, anterior TWI, no previous for comparison.    Assessment/Plan 1. Sepsis from HCAP in setting of COPD on home O2 and suspected ILD:  Suspected source lung. Organism unknown. Patient meets criteria given tachycardia, tachypnea, leukocytosis, and evidence of organ dysfunction.  Lactate 6.09 mmol/L and repeat ordered within 6 hours.  This patient is at high risk of poor outcomes with a SOFA score of 3(at least 2 of the following clinical criteria: respiratory rate of  22/min or greater, altered mentation, or systolic blood pressure of 100 mm Hg or less).  Antibiotics delivered in the ED.    -Sepsis bundle utilized:  -Blood and urine cultures drawn  -30 ml/kg bolus given in ED, will repeat lactic acid  -Start targeted antibiotics with vancomycin and cefepime and azithromycin, based on suspected source of infection pneumonia in NH patient  -Repeat renal function and complete blood count in AM  -Code SEPSIS called to E-link   -Continue home  Pulmicort -Albuterol PRN -Prednisone increased from 5 to 15 mg while acutely ill -Supplemental oxygen PRN -Consult to PCCM given Lactate 6 mmol/L -Check procalcitonin   2. AKI:  -Check UA and urine electrolytes -Fluids and trend BMP -Renal US ordered -Hold ARB  3. Elevated troponin:  Suspect this is demand type elevation.  -Cards consulted, appreciate cares. -Trend enzymes -Obtain echocardiogram  4. HTN:  -Hold losartan given hypotension and AKI -Continue statin  5. Alzheimers:  -Hold gabapentin and oxybutynin for now given altered mental status  6. ?Syphilis:   7. NIDDM:  DKA dobted, VBG without acidosis, and AG better explained by lactic acidosis for now -Hold metformin -Sliding scale corrections ordered  8. Severe PCM:  -Continue Glucerna  9. Pressure ulcer: Present on admission. -Consult WOC       DVT prophylaxis: Heparin  Code Status: FULL  Family Communication: Attempted phone calls to son Jaxin Fulfer [phone]) and [employer] [phone]).  Able to leave VM for former, no response at the time of this note. Disposition Plan: Anticipate IV FLuids and antibitoics and close monitoring of renal function, electroyles and blood pressure overnight.  Likely 4-5 days hospitalization. Consults called: PCCM and Cardiology Admission status: INPATIENT, stepdown      Medical decision making: Patient seen at 8:45 PM on 11/27/2015.  The patient was discussed with Dr. Franklyn Lor and Dr.  Marlyne Beards from [facility]. What exists of the patient's chart and outside recrods from NH were reviewed in depth. Additional records from the Nursing Home were requested, including a previous SNF summary report and a previous hospital admission note in [place]. Clinical condition: requiring ongoing fluids and close monitoring of BP, unstable overall.        Alberteen Sam [facility] Pager [phone]

## 2015-11-28 ENCOUNTER — Inpatient Hospital Stay (HOSPITAL_COMMUNITY): Payer: Medicare Other

## 2015-11-28 DIAGNOSIS — J189 Pneumonia, unspecified organism: Secondary | ICD-10-CM

## 2015-11-28 DIAGNOSIS — J449 Chronic obstructive pulmonary disease, unspecified: Secondary | ICD-10-CM

## 2015-11-28 DIAGNOSIS — A419 Sepsis, unspecified organism: Principal | ICD-10-CM

## 2015-11-28 DIAGNOSIS — R0600 Dyspnea, unspecified: Secondary | ICD-10-CM

## 2015-11-28 DIAGNOSIS — J8410 Pulmonary fibrosis, unspecified: Secondary | ICD-10-CM

## 2015-11-28 LAB — MRSA PCR SCREENING: MRSA by PCR: NEGATIVE

## 2015-11-28 LAB — ECHOCARDIOGRAM COMPLETE
E/e' ratio: 13.31
EWDT: 225 ms
FS: 43 % (ref 28–44)
IV/PV OW: 1.01
LA diam end sys: 31 mm
LASIZE: 31 mm
LAVOL: 39.3 mL
LAVOLA4C: 36.7 mL
LDCA: 3.14 cm2
LV e' LATERAL: 6.74 cm/s
LVEEAVG: 13.31
LVEEMED: 13.31
LVOT diameter: 20 mm
MV Dec: 225
MV pk A vel: 118 m/s
MVPG: 3 mmHg
MVPKEVEL: 89.7 m/s
PW: 9.9 mm — AB (ref 0.6–1.1)
RV TAPSE: 20.3 mm
Reg peak vel: 295 cm/s
TDI e' lateral: 6.74
TDI e' medial: 6.85
TRMAXVEL: 295 cm/s
Weight: 1808 oz

## 2015-11-28 LAB — CBC
HCT: 31.1 % — ABNORMAL LOW (ref 39.0–52.0)
HEMOGLOBIN: 10 g/dL — AB (ref 13.0–17.0)
MCH: 26.1 pg (ref 26.0–34.0)
MCHC: 31.2 g/dL (ref 30.0–36.0)
MCV: 83.6 fL (ref 78.0–100.0)
Platelets: 265 10*3/uL (ref 150–400)
RBC: 3.72 MIL/uL — AB (ref 4.22–5.81)
RDW: 13.3 % (ref 11.5–15.5)
WBC: 14.4 10*3/uL — ABNORMAL HIGH (ref 4.0–10.5)

## 2015-11-28 LAB — GLUCOSE, CAPILLARY
GLUCOSE-CAPILLARY: 233 mg/dL — AB (ref 65–99)
GLUCOSE-CAPILLARY: 270 mg/dL — AB (ref 65–99)
GLUCOSE-CAPILLARY: 249 mg/dL — AB (ref 65–99)
Glucose-Capillary: 252 mg/dL — ABNORMAL HIGH (ref 65–99)
Glucose-Capillary: 342 mg/dL — ABNORMAL HIGH (ref 65–99)

## 2015-11-28 LAB — COMPREHENSIVE METABOLIC PANEL
ALK PHOS: 72 U/L (ref 38–126)
ALT: 22 U/L (ref 17–63)
ANION GAP: 9 (ref 5–15)
AST: 31 U/L (ref 15–41)
Albumin: 1.9 g/dL — ABNORMAL LOW (ref 3.5–5.0)
BUN: 52 mg/dL — ABNORMAL HIGH (ref 6–20)
CALCIUM: 7.7 mg/dL — AB (ref 8.9–10.3)
CHLORIDE: 110 mmol/L (ref 101–111)
CO2: 19 mmol/L — AB (ref 22–32)
Creatinine, Ser: 3.26 mg/dL — ABNORMAL HIGH (ref 0.61–1.24)
GFR calc non Af Amer: 17 mL/min — ABNORMAL LOW (ref >60)
GFR, EST AFRICAN AMERICAN: 19 mL/min — AB (ref >60)
Glucose, Bld: 318 mg/dL — ABNORMAL HIGH (ref 65–99)
Potassium: 4 mmol/L (ref 3.5–5.1)
SODIUM: 138 mmol/L (ref 135–145)
Total Bilirubin: 0.8 mg/dL (ref 0.3–1.2)
Total Protein: 6.2 g/dL — ABNORMAL LOW (ref 6.5–8.1)

## 2015-11-28 LAB — HIV ANTIBODY (ROUTINE TESTING W REFLEX): HIV SCREEN 4TH GENERATION: NONREACTIVE

## 2015-11-28 LAB — TROPONIN I
TROPONIN I: 0.2 ng/mL — AB (ref <0.031)
TROPONIN I: 0.08 ng/mL — AB (ref <0.031)

## 2015-11-28 LAB — PROCALCITONIN: Procalcitonin: 3.74 ng/mL

## 2015-11-28 LAB — LACTIC ACID, PLASMA: Lactic Acid, Venous: 1.1 mmol/L (ref 0.5–2.0)

## 2015-11-28 LAB — SODIUM, URINE, RANDOM: Sodium, Ur: 70 mmol/L

## 2015-11-28 LAB — CREATININE, URINE, RANDOM: CREATININE, URINE: 76.77 mg/dL

## 2015-11-28 LAB — STREP PNEUMONIAE URINARY ANTIGEN: Strep Pneumo Urinary Antigen: NEGATIVE

## 2015-11-28 MED ORDER — INSULIN GLARGINE 100 UNIT/ML ~~LOC~~ SOLN
12.0000 [IU] | Freq: Every day | SUBCUTANEOUS | Status: DC
  Administered 2015-11-28: 12 [IU] via SUBCUTANEOUS
  Filled 2015-11-28 (×4): qty 0.12

## 2015-11-28 MED ORDER — GUAIFENESIN-DM 100-10 MG/5ML PO SYRP
5.0000 mL | ORAL_SOLUTION | ORAL | Status: DC | PRN
  Administered 2015-11-29 – 2015-11-30 (×5): 5 mL via ORAL
  Filled 2015-11-28 (×5): qty 5

## 2015-11-28 MED ORDER — INSULIN ASPART 100 UNIT/ML ~~LOC~~ SOLN
0.0000 [IU] | Freq: Three times a day (TID) | SUBCUTANEOUS | Status: DC
  Administered 2015-11-28: 8 [IU] via SUBCUTANEOUS
  Administered 2015-11-29: 5 [IU] via SUBCUTANEOUS
  Administered 2015-11-30: 2 [IU] via SUBCUTANEOUS

## 2015-11-28 MED ORDER — CETYLPYRIDINIUM CHLORIDE 0.05 % MT LIQD
7.0000 mL | Freq: Two times a day (BID) | OROMUCOSAL | Status: DC
  Administered 2015-11-28 – 2015-11-30 (×5): 7 mL via OROMUCOSAL

## 2015-11-28 MED ORDER — IPRATROPIUM-ALBUTEROL 0.5-2.5 (3) MG/3ML IN SOLN
3.0000 mL | Freq: Four times a day (QID) | RESPIRATORY_TRACT | Status: DC
  Administered 2015-11-28 – 2015-11-29 (×5): 3 mL via RESPIRATORY_TRACT
  Filled 2015-11-28 (×7): qty 3

## 2015-11-28 MED ORDER — INSULIN ASPART 100 UNIT/ML ~~LOC~~ SOLN
0.0000 [IU] | Freq: Every day | SUBCUTANEOUS | Status: DC
  Administered 2015-11-28: 2 [IU] via SUBCUTANEOUS

## 2015-11-28 NOTE — Progress Notes (Signed)
Inpatient Diabetes Program Recommendations  AACE/ADA: New Consensus Statement on Inpatient Glycemic Control (2015)  Target Ranges:  Prepandial:   less than 140 mg/dL      Peak postprandial:   less than 180 mg/dL (1-2 hours)      Critically ill patients:  140 - 180 mg/dL   Results for Steven Pope, [name] (MRN [id number]) as of 11/28/2015 13:54  Ref. Range 11/28/2015 00:18 11/28/2015 07:25 11/28/2015 13:07  Glucose-Capillary Latest Ref Range: 65-99 mg/dL 409342 (H) 811252 (H) 914233 (H)    Admit Sepsis Pneumonia   History: DM, COPD, Dementia  SNF Meds: Metformin 500 bid + Novolog 0-10 units QID  Current Insulin Orders: Novolog Sensitive Correction Scale/ SSI (0-9 units) TID AC + HS      MD- Please consider the following in-hospital insulin adjustments:  1. Start Levemir 10 units daily (0.1 units/kg dosing)  2. Increase Novolog Correction Scale/ SSI to Moderate scale (0-15 units) TID AC + HS      --Will follow patient during hospitalization--  Ambrose FinlandJeannine Johnston Dandra Shambaugh RN, MSN, CDE Diabetes Coordinator Inpatient Glycemic Control Team Team Pager: [phone] (8a-5p)

## 2015-11-28 NOTE — Progress Notes (Signed)
[place] TEAM 1 - Stepdown/ICU TEAM  Domenic SchwabJuan Martin Leipold  ZOX:[id number] DOB: 1935/04/24 DOA: 11/27/2015 PCP: Eloisa NorthernAMIN, SAAD, MD    Brief Narrative:  81 y.o. male with a history of COPD on home O2, ILD, NIDDM, syphilis?, severe PCM/FTT and HTN who presented with unresponsiveness.  He has dementia and is bedbound at baseline but is reportedly interactive. He was noted to have decreased responsiveness by staff, who found he had a BP in the 60/40 range and a CBG 350. On arrival to the ED he was obtunded, grimacing to loud voice or painful stimuli, and hypotensive.  In the ED he was afebrile, tachycardic, tachypneic, requiring more than normal supplemental O2 by [place], Cr 3.95 (baseline unknown, CKD not in problem list), WBC 17.9K, Hgb 12, lactate 6.09 mmol/L, CT head normal, and CXR with multifocal opacities presumed to be pneumonia.  Subjective: The patient is now awake and interactive.  He is quite pleasant.  He tells me he feels much better.  He denies chest pain nausea vomiting or abdominal pain.  He admits to intermittent shortness of breath.  Assessment & Plan:  Sepsis due to PNA in setting of COPD and suspected ILD  Continue broad-spectrum antibiotics - wean O2 as able - PCCM following as well   AKI Improving with volume expansion - baseline unclear - cont to follow   Elevated troponin Suspect demand ischemia - already trending down - no chest pain - further eval not likely to be required  HTN Controlled at present - follow w/ ongoing volume expansion  Alzheimers Alert and interactive presently - no agitation   ?Hx of Syphilis Details unclear   DM  CBG poorly controlled - adjust tx and follow  Severe PCM Nutrition to see  Pressure ulcer WOC consulted   DVT prophylaxis: heparin  Code Status: FULL CODE Family Communication: no family present at time of exam  Disposition Plan: transfer to tele bed   Consultants:  PCCM  Procedures: 6/26 TTE - pending    Antimicrobials:  Azithromycin 6/25 > Cefepime 6/25 > Zosyn 6/25  Vancomycin 6/25   Objective: Blood pressure 135/71, pulse 95, temperature 96.7 F (35.9 C), temperature source Axillary, resp. rate 36, weight 51.256 kg (113 lb), SpO2 93 %.  Intake/Output Summary (Last 24 hours) at 11/28/15 1531 Last data filed at 11/28/15 1509  Gross per 24 hour  Intake   4578 ml  Output   1225 ml  Net   3353 ml   Filed Weights   11/27/15 1828  Weight: 51.256 kg (113 lb)    Examination: General: No acute respiratory distress Lungs: Fine crackles throughout all fields - no wheeze - good air movement throughout Cardiovascular: Regular rate and rhythm without murmur gallop or rub  Abdomen: Nontender, nondistended, soft, bowel sounds positive, no rebound, no ascites, no appreciable mass Extremities: No significant cyanosis, clubbing, or edema bilateral lower extremities  CBC:  Recent Labs Lab 11/27/15 1755 11/28/15 0324  WBC 17.9* 14.4*  NEUTROABS 15.0*  --   HGB 12.6* 10.0*  HCT 39.2 31.1*  MCV 83.4 83.6  PLT 280 265   Basic Metabolic Panel:  Recent Labs Lab 11/27/15 1755 11/28/15 0324  NA 137 138  K 4.0 4.0  CL 102 110  CO2 20* 19*  GLUCOSE 302* 318*  BUN 53* 52*  CREATININE 3.95* 3.26*  CALCIUM 8.8* 7.7*   GFR: CrCl cannot be calculated (Unknown ideal weight.).  Liver Function Tests:  Recent Labs Lab 11/27/15 1755 11/28/15 0324  AST 43*  31  ALT 27 22  ALKPHOS 96 72  BILITOT 0.7 0.8  PROT 7.6 6.2*  ALBUMIN 2.3* 1.9*    Recent Labs Lab 11/27/15 1803  AMMONIA 29   Cardiac Enzymes:  Recent Labs Lab 11/27/15 2126 11/28/15 0324 11/28/15 1023  TROPONINI 0.30* 0.20* 0.08*    HbA1C: No results found for: HGBA1C  CBG:  Recent Labs Lab 11/28/15 0018 11/28/15 0725 11/28/15 1307  GLUCAP 342* 252* 233*    Recent Results (from the past 240 hour(s))  Blood Culture (routine x 2)     Status: None (Preliminary result)   Collection Time: 11/27/15   6:27 PM  Result Value Ref Range Status   Specimen Description BLOOD RIGHT ANTECUBITAL  Final   Special Requests BOTTLES DRAWN AEROBIC AND ANAEROBIC 5CC  Final   Culture NO GROWTH < 24 HOURS  Final   Report Status PENDING  Incomplete  Blood Culture (routine x 2)     Status: None (Preliminary result)   Collection Time: 11/27/15  6:30 PM  Result Value Ref Range Status   Specimen Description BLOOD LEFT HAND  Final   Special Requests IN PEDIATRIC BOTTLE 3CC  Final   Culture NO GROWTH < 24 HOURS  Final   Report Status PENDING  Incomplete  MRSA PCR Screening     Status: None   Collection Time: 11/27/15 11:13 PM  Result Value Ref Range Status   MRSA by PCR NEGATIVE NEGATIVE Final    Comment:        The GeneXpert MRSA Assay (FDA approved for NASAL specimens only), is one component of a comprehensive MRSA colonization surveillance program. It is not intended to diagnose MRSA infection nor to guide or monitor treatment for MRSA infections.      Scheduled Meds: . antiseptic oral rinse  7 mL Mouth Rinse BID  . atorvastatin  40 mg Oral Daily  . azithromycin  250 mg Intravenous Q24H  . budesonide  0.5 mg Nebulization BID  . ceFEPime (MAXIPIME) IV  1 g Intravenous Q24H  . feeding supplement (GLUCERNA SHAKE)  237 mL Oral QPM  . heparin  5,000 Units Subcutaneous Q8H  . insulin aspart  0-5 Units Subcutaneous QHS  . insulin aspart  0-9 Units Subcutaneous TID WC  . ipratropium-albuterol  3 mL Nebulization Q6H  . predniSONE  15 mg Oral Q breakfast  . sodium chloride flush  3 mL Intravenous Q12H  . [START ON 11/29/2015] vancomycin  750 mg Intravenous Q48H   Continuous Infusions: . sodium chloride 100 mL/hr at 11/28/15 0700     LOS: 1 day   Time spent: 35 minutes   Lonia BloodJeffrey T. McClung, MD [facility] Office  [phone] Pager - Text Page per Loretha StaplerAmion as per below:  On-Call/Text Page:      [email or web address]      password [email or web address]  If 7PM-7AM, please contact  night-coverage [email or web address] Password [email or web address] 11/28/2015, 3:31 PM

## 2015-11-28 NOTE — Progress Notes (Signed)
Interpreter Steven Pope for patient °

## 2015-11-28 NOTE — Progress Notes (Signed)
*  PRELIMINARY RESULTS* Echocardiogram 2D Echocardiogram has been performed.  Jeryl Columbialliott, Chuong Casebeer 11/28/2015, 3:58 PM

## 2015-11-28 NOTE — Consult Note (Signed)
Name: Steven Pope MRN: [id number] DOB: 08-05-1934    ADMISSION DATE:  11/27/2015 CONSULTATION DATE:  6/26  REFERRING MD :  Sharon SellerMcClung ([facility])   CHIEF COMPLAINT:  Acute hypoxic respiratory failure   BRIEF PATIENT DESCRIPTION:  81[facility] with hx COPD, ILD, dementia, severe malnutrition and FTT presented 6/25 with AMS, hypotension.  In ER was noted to have lactate 6, Scr 3.95, WBC 17.9.  BP responded quickly to volume and he was admitted by [facility] to SDU with probable HCAP and sepsis.  PCCM consulted to assist with respiratory failure and ?flare ILD.   SIGNIFICANT EVENTS    STUDIES:  CT head 6/26>>> neg acute  Renal u/s 6/26>>> neg hydro   HISTORY OF PRESENT ILLNESS:  81[facility] SNF [facility] with hx COPD, ILD, dementia, severe malnutrition and FTT presented 6/25 with AMS, hypotension.  In ER was noted to have lactate 6, Scr 3.95, WBC 17.9.  BP responded quickly to volume and he was admitted by [facility] to SDU with probable HCAP and sepsis.  PCCM consulted to assist with respiratory failure and ?flare ILD.   Currently some mild SOB which is baseline.  He feels much better than last night.  C/o some cough, occasionally productive of purulent sputum.  Denies chest pain, hemoptysis.   PAST MEDICAL HISTORY :   has a past medical history of COPD (chronic obstructive pulmonary disease) (HCC); Diabetes mellitus without complication (HCC); Syphilis; Alzheimer disease; Pulmonary fibrosis (HCC); Osteoarthritis; Cough; Hypertension; and Constipation.  has past surgical history that includes Cataract extraction. Prior to Admission medications   Medication Sig Start Date End Date Taking? Authorizing Provider  acetaminophen (TYLENOL) 325 MG tablet Take 650 mg by mouth every 6 (six) hours as needed for fever.   Yes Historical Provider, MD  atorvastatin (LIPITOR) 40 MG tablet Take 40 mg by mouth every morning.   Yes Historical Provider, MD    Bromfenac Sodium (PROLENSA) 0.07 % SOLN Place 1 drop into the left eye every morning. For 14 days, ending 12-04-15 11/20/15 12/04/15 Yes Historical Provider, MD  budesonide (PULMICORT) 0.5 MG/2ML nebulizer solution Take 0.5 mg by nebulization every 12 (twelve) hours. 2 ml inhale orally every 12 hours related to COPD exacerbation.  Rinse mouth with water.  Do not swallow.   Yes Historical Provider, MD  cetirizine (ZYRTEC) 10 MG tablet Take 10 mg by mouth every morning.   Yes Historical Provider, MD  Dextromethorphan HBr 15 MG CAPS Take 15 mg by mouth every 6 (six) hours as needed (for cough).   Yes Historical Provider, MD  dextromethorphan-guaiFENesin (ROBITUSSIN-DM) 10-100 MG/5ML liquid Take 10 mLs by mouth every 6 (six) hours as needed for cough.   Yes Historical Provider, MD  Difluprednate (DUREZOL) 0.05 % EMUL Place 1 drop into the left eye every morning. For 7 days ending 11-28-15 11/20/15 11/28/15 Yes Historical Provider, MD  feeding supplement, GLUCERNA SHAKE, (GLUCERNA SHAKE) LIQD Take 237 mLs by mouth every evening. Strawberry   Yes Historical Provider, MD  gabapentin (NEURONTIN) 100 MG capsule Take 100 mg by mouth 3 (three) times daily.   Yes Historical Provider, MD  glucagon (GLUCAGON EMERGENCY) 1 MG injection Inject 1 mg into the vein once as needed (for hypoglycemia (BS <60 mg/dl) if patient unable to swallow or unconscious).   Yes Historical Provider, MD  insulin aspart (NOVOLOG FLEXPEN) 100 UNIT/ML FlexPen Inject 0-10 Units into the skin 4 (four) times daily -  before meals and  at bedtime. 201 - 250 = 2 units 251 - 300 = 4 units 301 - 350 = 6 units 351 - 400 = 8 units If BS < 60, call MD If BS > 400, give 10 units and call MD   Yes Historical Provider, MD  ipratropium-albuterol (DUONEB) 0.5-2.5 (3) MG/3ML SOLN Take 3 mLs by nebulization every 6 (six) hours. 3 ml inhale orally every 6 hours related to COPD exacerbation.  Rinse mouth with water.  Do not swallow.   Yes Historical Provider, MD   losartan (COZAAR) 100 MG tablet Take 100 mg by mouth every morning.   Yes Historical Provider, MD  metFORMIN (GLUCOPHAGE) 500 MG tablet Take 500 mg by mouth 2 (two) times daily with a meal.   Yes Historical Provider, MD  Multiple Vitamin (MULTIVITAMIN) tablet Take 1 tablet by mouth every morning.   Yes Historical Provider, MD  oxybutynin (DITROPAN-XL) 10 MG 24 hr tablet Take 10 mg by mouth every morning.   Yes Historical Provider, MD  OXYGEN Inhale 2 L into the lungs continuous.   Yes Historical Provider, MD  predniSONE (DELTASONE) 5 MG tablet Take 5 mg by mouth daily with breakfast.   Yes Historical Provider, MD  sennosides-docusate sodium (SENOKOT-S) 8.6-50 MG tablet Take 1 tablet by mouth 2 (two) times daily.   Yes Historical Provider, MD  sorbitol 70 % solution Take 30 mLs by mouth daily as needed (for constipation).   Yes Historical Provider, MD   No Known Allergies  FAMILY HISTORY:  family history is not on file. SOCIAL HISTORY:  reports that he does not drink alcohol.  REVIEW OF SYSTEMS:   Unable, as per HPI above obtained from records.   SUBJECTIVE:   VITAL SIGNS: Temp:  [97.6 F (36.4 C)-98.4 F (36.9 C)] 97.6 F (36.4 C) (06/26 0727) Pulse Rate:  [77-102] 77 (06/26 0727) Resp:  [18-31] 20 (06/26 0727) BP: (80-127)/(47-73) 101/59 mmHg (06/26 0727) SpO2:  [92 %-100 %] 100 % (06/26 0742) Weight:  [51.256 kg (113 lb)] 51.256 kg (113 lb) (06/25 1828)  PHYSICAL EXAMINATION: General:  Pleasant, frail elderly male, NAD, eating breakfast  Neuro:  Awake, alert, mild confusion but overall appropriate, oriented x 3.   HEENT:  Mm moist, no JVD Cardiovascular:  s1s2 rrr Lungs:  resps even non labored on [place], crackles L>R, diminished bases  Abdomen:  Round, soft, non tender, +bs  Musculoskeletal:  Warm and dry, no edema    Recent Labs Lab 11/27/15 1755 11/28/15 0324  NA 137 138  K 4.0 4.0  CL 102 110  CO2 20* 19*  BUN 53* 52*  CREATININE 3.95* 3.26*  GLUCOSE 302* 318*     Recent Labs Lab 11/27/15 1755 11/28/15 0324  HGB 12.6* 10.0*  HCT 39.2 31.1*  WBC 17.9* 14.4*  PLT 280 265   Ct Head Wo Contrast  11/27/2015  CLINICAL DATA:  Acute mental status changes. EXAM: CT HEAD WITHOUT CONTRAST TECHNIQUE: Contiguous axial images were obtained from the base of the skull through the vertex without intravenous contrast. COMPARISON:  None. FINDINGS: Age related cerebral atrophy, ventriculomegaly and periventricular white matter disease. No acute intracranial findings such as hemispheric infarction an or intracranial hemorrhage. No mass lesions are identified. No extra-axial fluid collections. The brainstem and cerebellum are grossly normal. The bony structures are intact. The paranasal sinuses and mastoid air cells are clear. The globes are intact. IMPRESSION: Cerebral atrophy, ventriculomegaly and periventricular white matter disease but no acute intracranial findings or mass lesions. Electronically Signed  By: Rudie MeyerP.  Gallerani M.D.   On: 11/27/2015 19:10   [place] Renal  11/28/2015  CLINICAL DATA:  Acute kidney injury EXAM: RENAL / URINARY TRACT ULTRASOUND COMPLETE COMPARISON:  None. FINDINGS: Right Kidney: Length: 9.9 cm. Echogenicity within normal limits. No mass or hydronephrosis visualized. Left Kidney: Length: 11.6 the. Echogenicity within normal limits. No mass or hydronephrosis visualized. Bladder: Decompressed around a Foley catheter. IMPRESSION: Negative for hydronephrosis. Normal parenchymal thickness and echotexture. Electronically Signed   By: Ellery Plunkaniel R Mitchell M.D.   On: 11/28/2015 02:26   Dg Chest Port 1 View  11/27/2015  CLINICAL DATA:  81 year old male with altered mental status EXAM: PORTABLE CHEST 1 VIEW COMPARISON:  None. FINDINGS: Single portable view of the chest demonstrates emphysematous changes of the lungs with bronchiectatic changes. Patchy airspace density at the left lung base as well as in the right upper lobe scar concerning for developing pneumonia.  Clinical correlation and follow-up recommended. There is no pleural effusion or pneumothorax. The cardiac silhouette is within normal limits. No acute osseous pathology. IMPRESSION: Left lung base and right upper lobe airspace densities concerning for pneumonia. Follow-up recommended. Electronically Signed   By: Elgie CollardArash  Radparvar M.D.   On: 11/27/2015 18:27    ASSESSMENT / PLAN:  Acute on chronic hypoxic respiratory failure - r/t HCAP with underlying COPD and ILD.  Doubt ILD flare.  Sepsis - improving.  Lactate cleared.  AKI - renal u/s neg   PLAN -  Supplemental O2 as needed to keep sats 88-92% Cont broad spectrum abx for now  Continue gentle volume  F/u chem  F/u CXR  Pulmonary hygiene  Continue PO steroids -- on pred 5mg /day chronically, now on 15mg /day with ?AECOPD Duonebs, budesonide, PRN albuterol  Trend procalcitonin  Nutritional support  Consider palliative care involvement    PCCM available PRN, please call back if needed.   Dirk DressKaty Whiteheart, NP 11/28/2015  9:34 AM Pager: [phone] or [phone]

## 2015-11-29 ENCOUNTER — Inpatient Hospital Stay (HOSPITAL_COMMUNITY): Payer: Medicare Other

## 2015-11-29 DIAGNOSIS — G309 Alzheimer's disease, unspecified: Secondary | ICD-10-CM

## 2015-11-29 DIAGNOSIS — E43 Unspecified severe protein-calorie malnutrition: Secondary | ICD-10-CM

## 2015-11-29 DIAGNOSIS — R4182 Altered mental status, unspecified: Secondary | ICD-10-CM

## 2015-11-29 DIAGNOSIS — E119 Type 2 diabetes mellitus without complications: Secondary | ICD-10-CM

## 2015-11-29 DIAGNOSIS — N179 Acute kidney failure, unspecified: Secondary | ICD-10-CM

## 2015-11-29 DIAGNOSIS — I10 Essential (primary) hypertension: Secondary | ICD-10-CM

## 2015-11-29 DIAGNOSIS — R7989 Other specified abnormal findings of blood chemistry: Secondary | ICD-10-CM

## 2015-11-29 LAB — GLUCOSE, CAPILLARY
GLUCOSE-CAPILLARY: 109 mg/dL — AB (ref 65–99)
GLUCOSE-CAPILLARY: 172 mg/dL — AB (ref 65–99)
GLUCOSE-CAPILLARY: 216 mg/dL — AB (ref 65–99)
Glucose-Capillary: 76 mg/dL (ref 65–99)

## 2015-11-29 LAB — CBC
HCT: 30.2 % — ABNORMAL LOW (ref 39.0–52.0)
Hemoglobin: 9.9 g/dL — ABNORMAL LOW (ref 13.0–17.0)
MCH: 27.3 pg (ref 26.0–34.0)
MCHC: 32.8 g/dL (ref 30.0–36.0)
MCV: 83.4 fL (ref 78.0–100.0)
PLATELETS: 280 10*3/uL (ref 150–400)
RBC: 3.62 MIL/uL — ABNORMAL LOW (ref 4.22–5.81)
RDW: 13.2 % (ref 11.5–15.5)
WBC: 13.9 10*3/uL — AB (ref 4.0–10.5)

## 2015-11-29 LAB — COMPREHENSIVE METABOLIC PANEL
ALT: 29 U/L (ref 17–63)
AST: 41 U/L (ref 15–41)
Albumin: 2 g/dL — ABNORMAL LOW (ref 3.5–5.0)
Alkaline Phosphatase: 74 U/L (ref 38–126)
Anion gap: 6 (ref 5–15)
BILIRUBIN TOTAL: 0.6 mg/dL (ref 0.3–1.2)
BUN: 35 mg/dL — AB (ref 6–20)
CO2: 22 mmol/L (ref 22–32)
CREATININE: 1.87 mg/dL — AB (ref 0.61–1.24)
Calcium: 8.4 mg/dL — ABNORMAL LOW (ref 8.9–10.3)
Chloride: 111 mmol/L (ref 101–111)
GFR, EST AFRICAN AMERICAN: 38 mL/min — AB (ref >60)
GFR, EST NON AFRICAN AMERICAN: 33 mL/min — AB (ref >60)
Glucose, Bld: 104 mg/dL — ABNORMAL HIGH (ref 65–99)
Potassium: 3.1 mmol/L — ABNORMAL LOW (ref 3.5–5.1)
Sodium: 139 mmol/L (ref 135–145)
TOTAL PROTEIN: 6.2 g/dL — AB (ref 6.5–8.1)

## 2015-11-29 LAB — PROCALCITONIN: Procalcitonin: 1.57 ng/mL

## 2015-11-29 LAB — URINE CULTURE: CULTURE: NO GROWTH

## 2015-11-29 LAB — LEGIONELLA PNEUMOPHILA SEROGP 1 UR AG: L. PNEUMOPHILA SEROGP 1 UR AG: NEGATIVE

## 2015-11-29 MED ORDER — POTASSIUM CHLORIDE CRYS ER 20 MEQ PO TBCR
40.0000 meq | EXTENDED_RELEASE_TABLET | Freq: Once | ORAL | Status: AC
  Administered 2015-11-29: 40 meq via ORAL
  Filled 2015-11-29: qty 2

## 2015-11-29 MED ORDER — LEVOFLOXACIN 750 MG PO TABS
750.0000 mg | ORAL_TABLET | ORAL | Status: DC
  Administered 2015-11-29: 750 mg via ORAL
  Filled 2015-11-29 (×2): qty 1

## 2015-11-29 MED ORDER — GUAIFENESIN ER 600 MG PO TB12
600.0000 mg | ORAL_TABLET | Freq: Two times a day (BID) | ORAL | Status: DC
  Administered 2015-11-29 – 2015-11-30 (×3): 600 mg via ORAL
  Filled 2015-11-29 (×3): qty 1

## 2015-11-29 MED ORDER — PREDNISONE 10 MG PO TABS
10.0000 mg | ORAL_TABLET | Freq: Every day | ORAL | Status: DC
  Administered 2015-11-30: 10 mg via ORAL
  Filled 2015-11-29: qty 1

## 2015-11-29 MED ORDER — IPRATROPIUM-ALBUTEROL 0.5-2.5 (3) MG/3ML IN SOLN
3.0000 mL | Freq: Three times a day (TID) | RESPIRATORY_TRACT | Status: DC
  Administered 2015-11-30 (×2): 3 mL via RESPIRATORY_TRACT
  Filled 2015-11-29 (×2): qty 3

## 2015-11-29 NOTE — Progress Notes (Addendum)
81 [place]HR [place] carries a history of interstitial lung disease and COPD He was admitted with altered mental status, hypotension responded to fluids and labs showing leukocytosis lactate of 6 and a creatinine of 3.9 Chest x-ray which I have reviewed shows right upper lobe and left lower lobe airspace disease  On exam-sitting in bed, off O2 and appears comfortable but desaturates on minimal exertion, bibasal dry crackles, S1-S2 normal regular, no pedal edema soft and nontender abdomen  Labs-lactate cleared to 1.1, pro-calcitonin 3.7--> 1.6, white count decreased to 14 from 18, mildly positive troponins  Impression/plan-  Severe sepsis-resolved, clearance of lactate  AKI - appears improving with hydration  HCAP -treating with broad-spectrum antibiotics and appears to be responding  ILD/ COPD- unclear why he is on chronic prednisone, since he is much improved can continue home dose on dc -outpatient follow-up appt with us made on discharge  PCCM available as needed  Cyril Mourningakesh Itzae Mccurdy MD. FCCP. [facility] Pager [phone] If no response call [phone]   11/29/2015

## 2015-11-29 NOTE — Progress Notes (Signed)
[facility] Progress Note  Patient: Steven SchwabJuan Martin Pope ZOX:[id number]   PCP: Eloisa NorthernAMIN, SAAD, MD DOB: 1934-06-16   DOA: 11/27/2015   DOS: 11/29/2015   Date of Service: the patient was seen and examined on 11/29/2015  Subjective: Patient denies any acute complaint. Mentions his breathing is much better. Denies any chest pain or abdominal pain. Shortness of breath is present but getting better. Cough is productive. Nutrition: Tolerating oral diet  Brief hospital course: Pt. with PMH of COPD on home oxygen, IUD, NIDDM, HTN, FTT; admitted on 11/27/2015, with complaint of unresponsive episode, was found to have sepsis due to healthcare associated pneumonia with acute hypoxic respiratory failure as well as acute kidney injury and demand ischemia. Patient was treated with broad-spectrum antibiotics with significant improvement in mentation. Transfer to telemetry floor on 11/28/2015 Currently further plan is to monitor for improvement, I'll get physical therapy consult.  Assessment and Plan: 1. Sepsis (HCC) due to healthcare associated pneumonia. Underlying interstitial lung disease, underlying COPD. Chronic respiratory failure on home oxygen. Acute on chronic respiratory failure with hypoxia.  Chest x-ray was showing left lung base and right upper lobe density concerning for pneumonia on admission. Patient had leukocytosis, hypotension, tachypnea, with acute on chronic hypoxic respiratory failure. Streptococcal antigen is negative, urine legionella antigen is pending. Currently no sputum specimen available. Blood cultures and negative urine cultures are negative to date. Treated with azithromycin and cefepime. Transition to oral Levaquin, add Mucinex, add flutter device. Prednisone dose was increased we will start tapering to 10 mg tomorrow.  2. Acute kidney injury. Significantly improving with IV hydration. Currently we will stop IV hydration and monitor patient's response. Encourage oral  fluid. Avoid nephrotoxic medication.  3. Elevated troponin. Demand ischemia from acute hypoxia as well as an episode of unresponsiveness. Echogram shows increase ejection fraction without any wall motion abnormalities to suggest any acute ischemia. We will continue to closely monitor on telemetry. Denies any chest pain.  4. Type 2 diabetes mellitus. Patient will need outpatient hemoglobin A1c. Currently continue sliding scale insulin as well as 12 units Lantus.  5. Essential hypertension. Blood pressure stable will continue to monitor. May require antihypertensive medication later on down the road.  6. History of dementia with Alzheimer's. No evidence of agitation are present. Continue to monitor.  7. Pressure ulcer. Intact skin with non-blanchable redness stage I sacrum continue foam dressing.  8. Severe protein calorie malnutrition. Dietary consulted.  Pain management: When necessary Tylenol Activity: Consulted physical therapy Bowel regimen: last BM 11/28/2015 Diet: Carb modified diet DVT Prophylaxis: subcutaneous Heparin  Advance goals of care discussion: Full code  Family Communication: no family was present at bedside, at the time of interview.   Disposition:  Discharge to skilled nursing facility. Expected discharge date: 11/30/2015, pending physical therapy consult and oxygenation  Consultants: Pulmonary Procedures: Echocardiogram  Antibiotics: Anti-infectives    Start     Dose/Rate Route Frequency Ordered Stop   11/29/15 1800  vancomycin (VANCOCIN) IVPB 750 mg/150 ml premix  Status:  Discontinued     750 mg 150 mL/hr over 60 Minutes Intravenous Every 48 hours 11/27/15 1937 11/28/15 1603   11/29/15 1230  levofloxacin (LEVAQUIN) tablet 750 mg     750 mg Oral Every 48 hours 11/29/15 1218     11/28/15 0600  piperacillin-tazobactam (ZOSYN) IVPB 3.375 g  Status:  Discontinued     3.375 g 12.5 mL/hr over 240 Minutes Intravenous Every 8 hours 11/27/15 1821  11/27/15 1931   11/28/15 0400  piperacillin-tazobactam (ZOSYN)  IVPB 2.25 g  Status:  Discontinued     2.25 g 100 mL/hr over 30 Minutes Intravenous Every 8 hours 11/27/15 1932 11/27/15 2102   11/28/15 0200  ceFEPIme (MAXIPIME) 1 g in dextrose 5 % 50 mL IVPB  Status:  Discontinued     1 g 100 mL/hr over 30 Minutes Intravenous Every 24 hours 11/27/15 2106 11/29/15 1212   11/28/15 0000  azithromycin (ZITHROMAX) 250 mg in dextrose 5 % 125 mL IVPB  Status:  Discontinued     250 mg 125 mL/hr over 60 Minutes Intravenous Every 24 hours 11/27/15 2322 11/29/15 1212   11/27/15 1815  piperacillin-tazobactam (ZOSYN) IVPB 3.375 g     3.375 g 100 mL/hr over 30 Minutes Intravenous  Once 11/27/15 1810 11/27/15 1904   11/27/15 1815  vancomycin (VANCOCIN) IVPB 1000 mg/200 mL premix     1,000 mg 200 mL/hr over 60 Minutes Intravenous  Once 11/27/15 1810 11/27/15 2033        Intake/Output Summary (Last 24 hours) at 11/29/15 1433 Last data filed at 11/29/15 1152  Gross per 24 hour  Intake   1094 ml  Output   1575 ml  Net   -481 ml   Filed Weights   11/27/15 1828 11/28/15 2107  Weight: 51.256 kg (113 lb) 51.256 kg (113 lb)    Objective: Physical Exam: Filed Vitals:   11/29/15 0443 11/29/15 0720 11/29/15 0723 11/29/15 0829  BP: 134/95   147/76  Pulse: 86   87  Temp: 97.9 F (36.6 C)   98.2 F (36.8 C)  TempSrc:    Oral  Resp: 18   19  Weight:      SpO2: 95% 96% 98% 97%    General: Alert, Awake and Oriented to Time, Place and Person. Appear in mild distress Eyes: PERRL, Conjunctiva normal ENT: Oral Mucosa clear moist. Neck: no JVD, no Abnormal Mass Or lumps Cardiovascular: S1 and S2 Present, no Murmur, Respiratory: Bilateral Air entry equal and Decreased, inspiratory squeak bilaterally, bilateral Crackles, occasional wheezes Abdomen: Bowel Sound present, Soft and no tenderness Skin: no redness, no Rash  Extremities: no Pedal edema, no calf tenderness Neurologic: Grossly no focal neuro  deficit. Bilaterally Equal motor strength  Data Reviewed: CBC:  Recent Labs Lab 11/27/15 1755 11/28/15 0324 11/29/15 0748  WBC 17.9* 14.4* 13.9*  NEUTROABS 15.0*  --   --   HGB 12.6* 10.0* 9.9*  HCT 39.2 31.1* 30.2*  MCV 83.4 83.6 83.4  PLT 280 265 280   Basic Metabolic Panel:  Recent Labs Lab 11/27/15 1755 11/28/15 0324 11/29/15 0748  NA 137 138 139  K 4.0 4.0 3.1*  CL 102 110 111  CO2 20* 19* 22  GLUCOSE 302* 318* 104*  BUN 53* 52* 35*  CREATININE 3.95* 3.26* 1.87*  CALCIUM 8.8* 7.7* 8.4*    Liver Function Tests:  Recent Labs Lab 11/27/15 1755 11/28/15 0324 11/29/15 0748  AST 43* 31 41  ALT 27 22 29   ALKPHOS 96 72 74  BILITOT 0.7 0.8 0.6  PROT 7.6 6.2* 6.2*  ALBUMIN 2.3* 1.9* 2.0*   No results for input(s): LIPASE, AMYLASE in the last 168 hours.  Recent Labs Lab 11/27/15 1803  AMMONIA 29   Coagulation Profile: No results for input(s): INR, PROTIME in the last 168 hours. Cardiac Enzymes:  Recent Labs Lab 11/27/15 2126 11/28/15 0324 11/28/15 1023  TROPONINI 0.30* 0.20* 0.08*   BNP (last 3 results) No results for input(s): PROBNP in the last 8760 hours.  CBG:  Recent Labs Lab 11/28/15 1307 11/28/15 1651 11/28/15 2104 11/29/15 0733 11/29/15 1150  GLUCAP 233* 270* 249* 109* 172*    Studies: Dg Chest Portable 1 View  11/29/2015  CLINICAL DATA:  Healthcare associated pneumonia, history of COPD and pulmonary fibrosis EXAM: PORTABLE CHEST 1 VIEW COMPARISON:  Portable chest x-ray of November 27, 2015 FINDINGS: The right hemidiaphragm remains higher than the left. The pulmonary interstitial markings are increased bilaterally and are slightly more conspicuous overall. Air bronchograms are visible in the retrocardiac region on the left. The heart is top-normal in size. The pulmonary vascularity is not engorged. The mediastinum is normal in width. There is tortuosity of the descending thoracic aorta. There are degenerative changes of the thoracic  spine. IMPRESSION: Slight interval deterioration in the appearance of both lungs with increased interstitial opacities compatible with asymmetric edema or pneumonia superimposed upon underlying pulmonary fibrosis. Electronically Signed   By: David  [place] M.D.   On: 11/29/2015 07:38     Scheduled Meds: . antiseptic oral rinse  7 mL Mouth Rinse BID  . atorvastatin  40 mg Oral Daily  . budesonide  0.5 mg Nebulization BID  . feeding supplement (GLUCERNA SHAKE)  237 mL Oral QPM  . guaiFENesin  600 mg Oral BID  . heparin  5,000 Units Subcutaneous Q8H  . insulin aspart  0-15 Units Subcutaneous TID WC  . insulin aspart  0-5 Units Subcutaneous QHS  . insulin glargine  12 Units Subcutaneous QHS  . ipratropium-albuterol  3 mL Nebulization Q6H  . levofloxacin  750 mg Oral Q48H  . [START ON 11/30/2015] predniSONE  10 mg Oral Q breakfast  . sodium chloride flush  3 mL Intravenous Q12H   Continuous Infusions:  PRN Meds: acetaminophen **OR** acetaminophen, albuterol, guaiFENesin-dextromethorphan  Time spent: 30 minutes  Author: Lynden Oxford, MD [facility] Pager: [phone] 11/29/2015 2:33 PM  If 7PM-7AM, please contact night-coverage at [email or web address], password [facility]

## 2015-11-29 NOTE — Care Management Important Message (Signed)
Important Message  Patient Details  Name: Steven Pope MRN: [id number] Date of Birth: 01-Aug-1934   Medicare Important Message Given:  Yes    Bernadette HoitShoffner, Nicola Quesnell Coleman 11/29/2015, 8:18 AM

## 2015-11-30 DIAGNOSIS — L8990 Pressure ulcer of unspecified site, unspecified stage: Secondary | ICD-10-CM

## 2015-11-30 LAB — BASIC METABOLIC PANEL
ANION GAP: 12 (ref 5–15)
BUN: 28 mg/dL — ABNORMAL HIGH (ref 6–20)
CHLORIDE: 109 mmol/L (ref 101–111)
CO2: 22 mmol/L (ref 22–32)
Calcium: 9.2 mg/dL (ref 8.9–10.3)
Creatinine, Ser: 1.39 mg/dL — ABNORMAL HIGH (ref 0.61–1.24)
GFR calc non Af Amer: 47 mL/min — ABNORMAL LOW (ref >60)
GFR, EST AFRICAN AMERICAN: 54 mL/min — AB (ref >60)
GLUCOSE: 85 mg/dL (ref 65–99)
Potassium: 4 mmol/L (ref 3.5–5.1)
Sodium: 143 mmol/L (ref 135–145)

## 2015-11-30 LAB — GLUCOSE, CAPILLARY
GLUCOSE-CAPILLARY: 135 mg/dL — AB (ref 65–99)
Glucose-Capillary: 87 mg/dL (ref 65–99)
Glucose-Capillary: 125 mg/dL — ABNORMAL HIGH (ref 65–99)

## 2015-11-30 LAB — CBC
HEMATOCRIT: 34.3 % — AB (ref 39.0–52.0)
HEMOGLOBIN: 10.8 g/dL — AB (ref 13.0–17.0)
MCH: 26.2 pg (ref 26.0–34.0)
MCHC: 31.5 g/dL (ref 30.0–36.0)
MCV: 83.1 fL (ref 78.0–100.0)
Platelets: 353 10*3/uL (ref 150–400)
RBC: 4.13 MIL/uL — AB (ref 4.22–5.81)
RDW: 13.4 % (ref 11.5–15.5)
WBC: 15.9 10*3/uL — ABNORMAL HIGH (ref 4.0–10.5)

## 2015-11-30 LAB — MAGNESIUM: Magnesium: 1.3 mg/dL — ABNORMAL LOW (ref 1.7–2.4)

## 2015-11-30 MED ORDER — AMLODIPINE BESYLATE 5 MG PO TABS: 5.0000 mg | ORAL_TABLET | Freq: Every day | ORAL | Status: AC

## 2015-11-30 MED ORDER — METOPROLOL TARTRATE 25 MG PO TABS: 25.0000 mg | ORAL_TABLET | Freq: Two times a day (BID) | ORAL | Status: AC

## 2015-11-30 MED ORDER — LEVOFLOXACIN 750 MG PO TABS: 750.0000 mg | ORAL_TABLET | ORAL | Status: AC

## 2015-11-30 MED ORDER — HYDRALAZINE HCL 20 MG/ML IJ SOLN: 10.0000 mg | INTRAMUSCULAR | Status: DC | PRN

## 2015-11-30 MED ORDER — ASPIRIN EC 81 MG PO TBEC: 81.0000 mg | DELAYED_RELEASE_TABLET | Freq: Every day | ORAL | Status: AC

## 2015-11-30 MED ORDER — GUAIFENESIN ER 600 MG PO TB12: 600.0000 mg | ORAL_TABLET | Freq: Two times a day (BID) | ORAL | Status: AC

## 2015-11-30 MED ORDER — MAGNESIUM SULFATE 2 GM/50ML IV SOLN
2.0000 g | Freq: Once | INTRAVENOUS | Status: AC
  Administered 2015-11-30: 2 g via INTRAVENOUS
  Filled 2015-11-30: qty 50

## 2015-11-30 MED ORDER — METOPROLOL TARTRATE 25 MG PO TABS
25.0000 mg | ORAL_TABLET | Freq: Two times a day (BID) | ORAL | Status: DC
  Administered 2015-11-30: 25 mg via ORAL
  Filled 2015-11-30: qty 1

## 2015-11-30 MED ORDER — PREDNISONE 5 MG PO TABS: ORAL_TABLET | ORAL | Status: DC

## 2015-11-30 MED ORDER — AMLODIPINE BESYLATE 5 MG PO TABS
5.0000 mg | ORAL_TABLET | Freq: Every day | ORAL | Status: DC
  Administered 2015-11-30: 5 mg via ORAL
  Filled 2015-11-30: qty 1

## 2015-11-30 NOTE — Progress Notes (Signed)
Report called to "Sue Lushndrea" at [facility]. No questions @present .Will notify transport.

## 2015-11-30 NOTE — Discharge Summary (Addendum)
[facility] Discharge Summary   Patient: Steven Pope UJW:[id number]   PCP: [name], MD DOB: September 20, 1934   Date of admission: 11/27/2015   Date of discharge:  11/30/2015    Discharge Diagnoses:  Principal Problem:   Sepsis (HCC) Active Problems:   Alzheimer disease   COPD (chronic obstructive pulmonary disease) (HCC)   Pulmonary fibrosis (HCC)   Essential hypertension   Type 2 diabetes mellitus without complication, without long-term current use of insulin (HCC)   Elevated troponin   AKI (acute kidney injury) (HCC)   HCAP (healthcare-associated pneumonia)   Severe protein-calorie malnutrition (HCC)   Pressure ulcer   Altered mental status  Admitted From: SNF [facility] Disposition:  Back to SNF  Recommendations for Outpatient Follow-up:  1. Please follow-up with PCP in one week for monitoring for pneumonia, repeat CBC and BMP for acute kidney injury as well as persistent leukocytosis. 2. Please follow-up with pulmonary as scheduled for follow-up on ILD   Follow-up Information    Follow up with Rubye Oaks, NP On 12/20/2015.   Specialty:  Pulmonary Disease   Why:  10AM   Contact information:   [address] [place] [address] [phone]       Follow up with [name], MD. Schedule an appointment as soon as possible for a visit in 1 week.   Specialty:  Internal Medicine   Contact information:   [address] [place] [address] [phone]      Diet recommendation: Carb modified diet  Activity: The patient is advised to gradually reintroduce usual activities.  Discharge Condition: good  Code Status: Full code  History of present illness: As per the H and P dictated on admission, "Quay Simkin is a 81 y.o. male with a past medical history significant for , COPD on home O2, ILD NOS, NIDDM, syphilis?, severe PCM/FTT and HTN who presents with unresponsiveness.  Caveat that patient is altered and can provide no history for  himself. He has dementia and is bedbound at baseline but is interactive per NH report. Today, however, after lunch, he was noted to have decreased responsiveness. Staff checked his BP and found he had BP 60/40s and CBG 350 and so 9-1-1 was called. EMS found him and on arrival to the ED he was obtunded, grimacing to loud voice or painful stimuli and hypotensive."  Hospital Course:  Summary of his active problems in the hospital is as following. 1. Sepsis (HCC) due to healthcare associated pneumonia. Underlying interstitial lung disease, underlying COPD. Chronic respiratory failure on home oxygen. Acute on chronic respiratory failure with hypoxia.  Chest x-ray was showing left lung base and right upper lobe density concerning for pneumonia on admission. Patient had leukocytosis, hypotension, tachypnea, with acute on chronic hypoxic respiratory failure. Streptococcal antigen is negative, urine legionella antigen is negative. Currently no sputum specimen available for culture. Blood cultures and negative urine cultures are negative to date. Treated with azithromycin and cefepime and one dose of vancomycin. Transition to oral Levaquin, completed a 7 day treatment course. Add Mucinex, add flutter device. Prednisone dose was increased on admission, will taper to 10 mg starting 11/30/2015 and transition back to 5 mg daily until follow-up with pulmonary. Persistent leukocytosis was thought secondary to steroids.  2. Acute kidney injury. Significantly improving with IV hydration. 3.95>> 1.39 Encourage oral fluid. Discontinue losartan on discharge, Avoid nephrotoxic medication.  3. Elevated troponin. Demand ischemia from acute hypoxia as well as an episode of unresponsiveness. EKG  nonischemic, Echocardiogram shows increase ejection fraction without any wall motion abnormalities to suggest any acute ischemia. No indication for inpatient workup at present. Denies any chest pain. The patient has  any symptoms of angina or angina equivalent in future he will require referral to cardiology. Aspirin added to the regimen.   4. Type 2 diabetes mellitus. Patient will need outpatient hemoglobin A1c. Patient was given sliding scale insulin as well as Lantus in the hospital. On discharge I will hold Lantus since patient will be on tapering dose of prednisone. Continue metformin since his renal function is improved.  5. Essential hypertension. Blood pressure elevated. Patient was on 100 mg losartan. Due to his renal function I would discontinue it and change the patient to amlodipine and Lopressor.  6. History of dementia with Alzheimer's. No evidence of agitation are present. Continue to monitor.  7. Pressure ulcer. Intact skin with non-blanchable redness stage I sacrum continue foam dressing.  8. Severe protein calorie malnutrition. Continue glycerin on discharge.  All other chronic medical condition were stable during the hospitalization.  Transfer back to SNF, which was arranged by Child psychotherapist and case Production designer, theatre/television/film. On the day of the discharge the patient's O2 was stable on 2 L, and no other acute medical condition were reported by patient. the patient was felt safe to be discharge at back to SNF with physical therapy.  Procedures and Results:  Echocardiogram  Study Conclusions  - Left ventricle: The cavity size was normal. Systolic function was  vigorous. The estimated ejection fraction was in the range of 65%  to 70%. Wall motion was normal; there were no regional wall  motion abnormalities. Doppler parameters are consistent with  abnormal left ventricular relaxation (grade 1 diastolic  dysfunction). Doppler parameters are consistent with elevated  ventricular end-diastolic filling pressure. - Aortic valve: Valve mobility was restricted. - Aortic root: The aortic root was normal in size. - Mitral valve: Calcified annulus. Mildly thickened leaflets .  There was  trivial regurgitation. - Left atrium: The atrium was normal in size. - Right ventricle: The cavity size was mildly dilated. Wall  thickness was normal. Systolic function was mildly reduced. - Tricuspid valve: There was mild regurgitation. - Pulmonary arteries: Systolic pressure was mildly increased. PA  peak pressure: 38 mm Hg (S). - Inferior vena cava: The vessel was normal in size. - Pericardium, extracardiac: There was no pericardial effusion.  Consultations:  Pulmonary  DISCHARGE MEDICATION: Current Discharge Medication List    START taking these medications   Details  amLODipine (NORVASC) 5 MG tablet Take 1 tablet (5 mg total) by mouth daily. Qty: 30 tablet, Refills: 0    aspirin EC 81 MG tablet Take 1 tablet (81 mg total) by mouth daily. Qty: 30 tablet, Refills: 0    guaiFENesin (MUCINEX) 600 MG 12 hr tablet Take 1 tablet (600 mg total) by mouth 2 (two) times daily. Qty: 30 tablet, Refills: 0    levofloxacin (LEVAQUIN) 750 MG tablet Take 1 tablet (750 mg total) by mouth every other day. Qty: 2 tablet, Refills: 0    metoprolol tartrate (LOPRESSOR) 25 MG tablet Take 1 tablet (25 mg total) by mouth 2 (two) times daily. Qty: 60 tablet, Refills: 0      CONTINUE these medications which have CHANGED   Details  predniSONE (DELTASONE) 5 MG tablet Take 10mg  daily for 3days, then resume 5 mg daily. Qty: 60 tablet, Refills: 0      CONTINUE these medications which have NOT CHANGED   Details  acetaminophen (TYLENOL) 325 MG tablet Take 650 mg by mouth every 6 (six) hours as needed for fever.    atorvastatin (LIPITOR) 40 MG tablet Take 40 mg by mouth every morning.    Bromfenac Sodium (PROLENSA) 0.07 % SOLN Place 1 drop into the left eye every morning. For 14 days, ending 12-04-15    budesonide (PULMICORT) 0.5 MG/2ML nebulizer solution Take 0.5 mg by nebulization every 12 (twelve) hours. 2 ml inhale orally every 12 hours related to COPD exacerbation.  Rinse mouth with water.   Do not swallow.    cetirizine (ZYRTEC) 10 MG tablet Take 10 mg by mouth every morning.    dextromethorphan-guaiFENesin (ROBITUSSIN-DM) 10-100 MG/5ML liquid Take 10 mLs by mouth every 6 (six) hours as needed for cough.    feeding supplement, GLUCERNA SHAKE, (GLUCERNA SHAKE) LIQD Take 237 mLs by mouth every evening. Strawberry    gabapentin (NEURONTIN) 100 MG capsule Take 100 mg by mouth 3 (three) times daily.    glucagon (GLUCAGON EMERGENCY) 1 MG injection Inject 1 mg into the vein once as needed (for hypoglycemia (BS <60 mg/dl) if patient unable to swallow or unconscious).    insulin aspart (NOVOLOG FLEXPEN) 100 UNIT/ML FlexPen Inject 0-10 Units into the skin 4 (four) times daily -  before meals and at bedtime. 201 - 250 = 2 units 251 - 300 = 4 units 301 - 350 = 6 units 351 - 400 = 8 units If BS < 60, call MD If BS > 400, give 10 units and call MD    ipratropium-albuterol (DUONEB) 0.5-2.5 (3) MG/3ML SOLN Take 3 mLs by nebulization every 6 (six) hours. 3 ml inhale orally every 6 hours related to COPD exacerbation.  Rinse mouth with water.  Do not swallow.    metFORMIN (GLUCOPHAGE) 500 MG tablet Take 500 mg by mouth 2 (two) times daily with a meal.    Multiple Vitamin (MULTIVITAMIN) tablet Take 1 tablet by mouth every morning.    OXYGEN Inhale 2 L into the lungs continuous.    sennosides-docusate sodium (SENOKOT-S) 8.6-50 MG tablet Take 1 tablet by mouth 2 (two) times daily.    sorbitol 70 % solution Take 30 mLs by mouth daily as needed (for constipation).      STOP taking these medications     Dextromethorphan HBr 15 MG CAPS      Difluprednate (DUREZOL) 0.05 % EMUL      losartan (COZAAR) 100 MG tablet      oxybutynin (DITROPAN-XL) 10 MG 24 hr tablet        No Known Allergies Discharge Instructions    Diet - low sodium heart healthy    Complete by:  As directed      Diet Carb Modified    Complete by:  As directed      Discharge instructions    Complete by:  As directed    It is important that you read following instructions as well as go over your medication list with RN to help you understand your care after this hospitalization.  Discharge Instructions: Please follow-up with PCP in one week with repeat BMP  Please request your primary care physician to go over all Hospital Tests and Procedure/Radiological results at the follow up,  Please get all Hospital records sent to your PCP by signing hospital release before you go home.   Do not take more than prescribed Pain, Sleep and Anxiety Medications. You were cared for by a hospitalist during your hospital stay. If you have any  questions about your discharge medications or the care you received while you were in the hospital after you are discharged, you can call the unit and ask to speak with the hospitalist on call if the hospitalist that took care of you is not available.  Once you are discharged, your primary care physician will handle any further medical issues. Please note that NO REFILLS for any discharge medications will be authorized once you are discharged, as it is imperative that you return to your primary care physician (or establish a relationship with a primary care physician if you do not have one) for your aftercare needs so that they can reassess your need for medications and monitor your lab values. You Must read complete instructions/literature along with all the possible adverse reactions/side effects for all the Medicines you take and that have been prescribed to you. Take any new Medicines after you have completely understood and accept all the possible adverse reactions/side effects. Wear Seat belts while driving.     Flutter valve    Complete by:  As directed      Increase activity slowly    Complete by:  As directed           Discharge Exam: Filed Weights   11/27/15 1828 11/28/15 2107 11/29/15 2030  Weight: 51.256 kg (113 lb) 51.256 kg (113 lb) 50.349 kg (111 lb)   Filed Vitals:     11/30/15 0704 11/30/15 0817  BP: 180/97 180/92  Pulse:  101  Temp:  98.4 F (36.9 C)  Resp:  20   General: Appear in mild distress, no Rash; Oral Mucosa moist. Cardiovascular: S1 and S2 Present, aortic systolic Murmur, no JVD Respiratory: Bilateral Air entry present and bilateral Crackles, inspiratory squeak, no wheezes Abdomen: Bowel Sound present, Soft and no tenderness Extremities: no Pedal edema, no calf tenderness Neurology: Grossly no focal neuro deficit.  The results of significant diagnostics from this hospitalization (including imaging, microbiology, ancillary and laboratory) are listed below for reference.    Significant Diagnostic Studies: Ct Head Wo Contrast  11/27/2015  CLINICAL DATA:  Acute mental status changes. EXAM: CT HEAD WITHOUT CONTRAST TECHNIQUE: Contiguous axial images were obtained from the base of the skull through the vertex without intravenous contrast. COMPARISON:  None. FINDINGS: Age related cerebral atrophy, ventriculomegaly and periventricular white matter disease. No acute intracranial findings such as hemispheric infarction an or intracranial hemorrhage. No mass lesions are identified. No extra-axial fluid collections. The brainstem and cerebellum are grossly normal. The bony structures are intact. The paranasal sinuses and mastoid air cells are clear. The globes are intact. IMPRESSION: Cerebral atrophy, ventriculomegaly and periventricular white matter disease but no acute intracranial findings or mass lesions. Electronically Signed   By: Rudie MeyerP.  Gallerani M.D.   On: 11/27/2015 19:10   [place] Renal  11/28/2015  CLINICAL DATA:  Acute kidney injury EXAM: RENAL / URINARY TRACT ULTRASOUND COMPLETE COMPARISON:  None. FINDINGS: Right Kidney: Length: 9.9 cm. Echogenicity within normal limits. No mass or hydronephrosis visualized. Left Kidney: Length: 11.6 the. Echogenicity within normal limits. No mass or hydronephrosis visualized. Bladder: Decompressed around a Foley  catheter. IMPRESSION: Negative for hydronephrosis. Normal parenchymal thickness and echotexture. Electronically Signed   By: Ellery Plunkaniel R Mitchell M.D.   On: 11/28/2015 02:26   Dg Chest Portable 1 View  11/29/2015  CLINICAL DATA:  Healthcare associated pneumonia, history of COPD and pulmonary fibrosis EXAM: PORTABLE CHEST 1 VIEW COMPARISON:  Portable chest x-ray of November 27, 2015 FINDINGS: The right hemidiaphragm remains higher than  the left. The pulmonary interstitial markings are increased bilaterally and are slightly more conspicuous overall. Air bronchograms are visible in the retrocardiac region on the left. The heart is top-normal in size. The pulmonary vascularity is not engorged. The mediastinum is normal in width. There is tortuosity of the descending thoracic aorta. There are degenerative changes of the thoracic spine. IMPRESSION: Slight interval deterioration in the appearance of both lungs with increased interstitial opacities compatible with asymmetric edema or pneumonia superimposed upon underlying pulmonary fibrosis. Electronically Signed   By: David  [place] M.D.   On: 11/29/2015 07:38   Dg Chest Port 1 View  11/27/2015  CLINICAL DATA:  81 year old male with altered mental status EXAM: PORTABLE CHEST 1 VIEW COMPARISON:  None. FINDINGS: Single portable view of the chest demonstrates emphysematous changes of the lungs with bronchiectatic changes. Patchy airspace density at the left lung base as well as in the right upper lobe scar concerning for developing pneumonia. Clinical correlation and follow-up recommended. There is no pleural effusion or pneumothorax. The cardiac silhouette is within normal limits. No acute osseous pathology. IMPRESSION: Left lung base and right upper lobe airspace densities concerning for pneumonia. Follow-up recommended. Electronically Signed   By: Elgie Collard M.D.   On: 11/27/2015 18:27    Microbiology: Recent Results (from the past 240 hour(s))  Blood Culture  (routine x 2)     Status: None (Preliminary result)   Collection Time: 11/27/15  6:27 PM  Result Value Ref Range Status   Specimen Description BLOOD RIGHT ANTECUBITAL  Final   Special Requests BOTTLES DRAWN AEROBIC AND ANAEROBIC 5CC  Final   Culture NO GROWTH 2 DAYS  Final   Report Status PENDING  Incomplete  Blood Culture (routine x 2)     Status: None (Preliminary result)   Collection Time: 11/27/15  6:30 PM  Result Value Ref Range Status   Specimen Description BLOOD LEFT HAND  Final   Special Requests IN PEDIATRIC BOTTLE 3CC  Final   Culture NO GROWTH 2 DAYS  Final   Report Status PENDING  Incomplete  Urine culture     Status: None   Collection Time: 11/27/15  8:29 PM  Result Value Ref Range Status   Specimen Description URINE, CATHETERIZED  Final   Special Requests NONE  Final   Culture NO GROWTH  Final   Report Status 11/29/2015 FINAL  Final  MRSA PCR Screening     Status: None   Collection Time: 11/27/15 11:13 PM  Result Value Ref Range Status   MRSA by PCR NEGATIVE NEGATIVE Final    Comment:        The GeneXpert MRSA Assay (FDA approved for NASAL specimens only), is one component of a comprehensive MRSA colonization surveillance program. It is not intended to diagnose MRSA infection nor to guide or monitor treatment for MRSA infections.      Labs: CBC:  Recent Labs Lab 11/27/15 1755 11/28/15 0324 11/29/15 0748 11/30/15 0518  WBC 17.9* 14.4* 13.9* 15.9*  NEUTROABS 15.0*  --   --   --   HGB 12.6* 10.0* 9.9* 10.8*  HCT 39.2 31.1* 30.2* 34.3*  MCV 83.4 83.6 83.4 83.1  PLT 280 265 280 353   Basic Metabolic Panel:  Recent Labs Lab 11/27/15 1755 11/28/15 0324 11/29/15 0748 11/30/15 0518  NA 137 138 139 143  K 4.0 4.0 3.1* 4.0  CL 102 110 111 109  CO2 20* 19* 22 22  GLUCOSE 302* 318* 104* 85  BUN 53* 52* 35*  28*  CREATININE 3.95* 3.26* 1.87* 1.39*  CALCIUM 8.8* 7.7* 8.4* 9.2  MG  --   --   --  1.3*   Liver Function Tests:  Recent Labs Lab  11/27/15 1755 11/28/15 0324 11/29/15 0748  AST 43* 31 41  ALT 27 22 29   ALKPHOS 96 72 74  BILITOT 0.7 0.8 0.6  PROT 7.6 6.2* 6.2*  ALBUMIN 2.3* 1.9* 2.0*   No results for input(s): LIPASE, AMYLASE in the last 168 hours.  Recent Labs Lab 11/27/15 1803  AMMONIA 29   Cardiac Enzymes:  Recent Labs Lab 11/27/15 2126 11/28/15 0324 11/28/15 1023  TROPONINI 0.30* 0.20* 0.08*   BNP (last 3 results) No results for input(s): BNP in the last 8760 hours. CBG:  Recent Labs Lab 11/29/15 0733 11/29/15 1150 11/29/15 1800 11/29/15 2122 11/30/15 0735  GLUCAP 109* 172* 216* 76 87   Time spent: 30 minutes  Signed:  Delbert Darley  [facility]  11/30/2015  , 12:11 PM

## 2015-11-30 NOTE — Progress Notes (Signed)
Initial Nutrition Assessment  DOCUMENTATION CODES:   Severe malnutrition in context of chronic illness  INTERVENTION:   Plans for discharge to SNF.   Recommend continuation of nutritional supplementation post discharge.   NUTRITION DIAGNOSIS:   Malnutrition related to chronic illness as evidenced by severe depletion of body fat, severe depletion of muscle mass.  GOAL:   Patient will meet greater than or equal to 90% of their needs  MONITOR:   PO intake, Supplement acceptance, Weight trends, Labs, I & O's, Skin  REASON FOR ASSESSMENT:   Consult Assessment of nutrition requirement/status  ASSESSMENT:   Pt. with PMH of COPD on home oxygen, IUD, NIDDM, HTN, FTT; admitted on 11/27/2015, with complaint of unresponsive episode, was found to have sepsis due to healthcare associated pneumonia with acute hypoxic respiratory failure as well as acute kidney injury and demand ischemia.   Meal completion has been 0-25%. Pt reports his appetite is "so so". Pt reports good intake PTA with usual consumption of at least 3 meals a day with nutritional supplementation on occasion. He does reports some abdominal pain that he reports is not associated with food intake. Usual body weight unknown to pt. Noted, no new height recorded. Recommend obtaining new height measurement. Pt currently has Glucerna Shake ordered and has been consuming them. Plans for discharge to SNF. Recommend continuation of nutritional supplements at SNF to aid in caloric and protein needs.   Nutrition-Focused physical exam completed. Findings are moderate to severe fat depletion, moderate to severe muscle depletion, and no edema.   Labs and medications reviewed.   Diet Order:  Diet Carb Modified Fluid consistency:: Thin; Room service appropriate?: Yes Diet - low sodium heart healthy Diet Carb Modified  Skin:  Wound (see comment) (Stage I pressure on sacrum)  Last BM:  6/26  Height:   Ht Readings from Last 1  Encounters:  No data found for Ht    Weight:   Wt Readings from Last 1 Encounters:  11/29/15 111 lb (50.349 kg)    Ideal Body Weight:   N/A  BMI:  There is no height on file to calculate BMI.  Estimated Nutritional Needs:   Kcal:  1650-1850  Protein:  75-85 grams  Fluid:  1.6 - 1.8 L/day  EDUCATION NEEDS:   No education needs identified at this time  Roslyn SmilingStephanie Ariba Lehnen, MS, RD, LDN Pager # [phone] After hours/ weekend pager # [phone]

## 2015-11-30 NOTE — [place] FL2 (Signed)
[place] MEDICAID FL2 LEVEL OF CARE SCREENING TOOL     IDENTIFICATION  Patient Name: Steven Pope Birthdate: Aug 23, 1934 Sex: male Admission Date (Current Location): 11/27/2015  [facility] [place]   Facility and Address:  [facility] [place], [address], [place], [place] [address]      Provider Number: [id number]  Attending Physician Name and Address:  Rolly SalterPranav M Patel, MD  Relative Name and Phone Number:  Rogers SeedsOrlando Naclerio - [phone] and Tye SavoyJuan Zuleto - [phone].    Current Level of Care:   Recommended Level of Care: Skilled Nursing Facility ([facility]) Prior Approval Number:    Date Approved/Denied:   PASRR Number:    Discharge Plan: SNF    Current Diagnoses: Patient Active Problem List   Diagnosis Date Noted  . Altered mental status   . Sepsis (HCC) 11/27/2015  . Alzheimer disease 11/27/2015  . COPD (chronic obstructive pulmonary disease) (HCC) 11/27/2015  . Pulmonary fibrosis (HCC) 11/27/2015  . Essential hypertension 11/27/2015  . Type 2 diabetes mellitus without complication, without long-term current use of insulin (HCC) 11/27/2015  . Elevated troponin 11/27/2015  . AKI (acute kidney injury) (HCC) 11/27/2015  . HCAP (healthcare-associated pneumonia) 11/27/2015  . Severe protein-calorie malnutrition (HCC) 11/27/2015  . Pressure ulcer 11/27/2015    Orientation RESPIRATION BLADDER Height & Weight     Self  Other (Comment) (3 Liters Oxygen) Incontinent Weight: 111 lb (50.349 kg) Height:     BEHAVIORAL SYMPTOMS/MOOD NEUROLOGICAL BOWEL NUTRITION STATUS      Continent Diet (Low sodium - Heart healthy)  AMBULATORY STATUS COMMUNICATION OF NEEDS Skin   Total Care (Patient non-ambulatory) Non-Verbally Other (Comment) (Stage 1 pressure ulcer to sacrum)                       Personal Care Assistance Level of Assistance  Bathing, Feeding, Dressing Bathing Assistance: Limited  assistance Feeding assistance: Independent Dressing Assistance: Limited assistance     Functional Limitations Info  Sight, Hearing, Speech Sight Info: Impaired Hearing Info: Adequate Speech Info: Adequate    SPECIAL CARE FACTORS FREQUENCY                       Contractures Contractures Info: Not present    Additional Factors Info  Code Status, Allergies, Insulin Sliding Scale Code Status Info: Full Allergies Info: No known allergies   Insulin Sliding Scale Info: Insulin 0-5 Units at bedtime daily; Insulin 0-15 Units 3X daily with meals       Current Medications (11/30/2015):  This is the current hospital active medication list Current Facility-Administered Medications  Medication Dose Route Frequency Provider Last Rate Last Dose  . acetaminophen (TYLENOL) tablet 650 mg  650 mg Oral Q6H PRN Alberteen Samhristopher P Danford, MD   650 mg at 11/29/15 0321   Or  . acetaminophen (TYLENOL) suppository 650 mg  650 mg Rectal Q6H PRN Alberteen Samhristopher P Danford, MD      . albuterol (PROVENTIL) (2.5 MG/3ML) 0.083% nebulizer solution 2.5 mg  2.5 mg Nebulization Q2H PRN Alberteen Samhristopher P Danford, MD      . amLODipine (NORVASC) tablet 5 mg  5 mg Oral Daily Rolly SalterPranav M Patel, MD   5 mg at 11/30/15 1054  . antiseptic oral rinse (CPC / CETYLPYRIDINIUM CHLORIDE 0.05%) solution 7 mL  7 mL Mouth Rinse BID Alberteen Samhristopher P Danford, MD   7 mL at 11/30/15 1055  . atorvastatin (LIPITOR) tablet 40 mg  40 mg Oral Daily Alberteen Samhristopher P Danford, MD   40 mg at 11/30/15 1054  . budesonide (PULMICORT) nebulizer solution 0.5 mg  0.5 mg Nebulization BID Alberteen Samhristopher P Danford, MD   0.5 mg at 11/30/15 0742  . feeding supplement (GLUCERNA SHAKE) (GLUCERNA SHAKE) liquid 237 mL  237 mL Oral QPM Alberteen Samhristopher P Danford, MD   237 mL at 11/29/15 1802  . guaiFENesin (MUCINEX) 12 hr tablet 600 mg  600 mg Oral BID Rolly SalterPranav M Patel, MD   600 mg at 11/30/15 1054  . guaiFENesin-dextromethorphan (ROBITUSSIN DM) 100-10 MG/5ML syrup 5 mL  5 mL Oral Q4H  PRN Leda GauzeKaren J Kirby-Graham, NP   5 mL at 11/30/15 0817  . heparin injection 5,000 Units  5,000 Units Subcutaneous Q8H Alberteen Samhristopher P Danford, MD   5,000 Units at 11/30/15 0531  . hydrALAZINE (APRESOLINE) injection 10 mg  10 mg Intravenous Q4H PRN Rolly SalterPranav M Patel, MD      . insulin aspart (novoLOG) injection 0-15 Units  0-15 Units Subcutaneous TID WC Lonia BloodJeffrey T McClung, MD   2 Units at 11/30/15 1303  . insulin aspart (novoLOG) injection 0-5 Units  0-5 Units Subcutaneous QHS Lonia BloodJeffrey T McClung, MD   2 Units at 11/28/15 2114  . insulin glargine (LANTUS) injection 12 Units  12 Units Subcutaneous QHS Lonia BloodJeffrey T McClung, MD   12 Units at 11/28/15 2244  . ipratropium-albuterol (DUONEB) 0.5-2.5 (3) MG/3ML nebulizer solution 3 mL  3 mL Nebulization TID Rolly SalterPranav M Patel, MD   3 mL at 11/30/15 0742  . levofloxacin (LEVAQUIN) tablet 750 mg  750 mg Oral Q48H Rolly SalterPranav M Patel, MD   750 mg at 11/29/15 1231  . metoprolol tartrate (LOPRESSOR) tablet 25 mg  25 mg Oral BID Rolly SalterPranav M Patel, MD   25 mg at 11/30/15 1054  . predniSONE (DELTASONE) tablet 10 mg  10 mg Oral Q breakfast Rolly SalterPranav M Patel, MD   10 mg at 11/30/15 0817  . sodium chloride flush (NS) 0.9 % injection 3 mL  3 mL Intravenous Q12H Alberteen Samhristopher P Danford, MD   3 mL at 11/30/15 1055     Discharge Medications: Please see discharge summary for a list of discharge medications.  Relevant Imaging Results:  Relevant Lab Results:   Additional Information    Okey DupreCrawford, Lazaro ArmsVanessa Bradley, LCSW

## 2015-11-30 NOTE — Progress Notes (Signed)
D/C'd via ambulance to [facility]. All belongings sent with pt. Family notified by Erie NoeVanessa, SW. Patient without pain, smiling when d/c'd.

## 2015-11-30 NOTE — Clinical Social Work Note (Signed)
Clinical Social Work Assessment  Patient Details  Name: Steven Pope MRN: [id number] Date of Birth: [birth year]  Date of referral:  11/30/15               Reason for consult:  Facility Placement ([facility])                Permission sought to share information with:  FaDomenic Schwabmily Supports (Patient non-verbal and sons are POA) Permission granted to share information::  No (Patient non-verbal, sons POA)  Name::     [place] and Tye SavoyJuan Zuleto  Agency::     Relationship::  Sons  Contact Information:  Pamala HurryOrlando [phone] and Lars MageJuan [phone]  Housing/Transportation Living arrangements for the past 2 months:  Skilled Nursing Facility [facility] since [facility].) Source of Information:  Other (Comment Required), Adult Children (Chart review and talked with son Algeriarlando) Patient Interpreter Needed:  None Criminal Activity/Legal Involvement Pertinent to Current Situation/Hospitalization:  No - Comment as needed Significant Relationships:  Adult Children Lives with:  Facility Resident Do you feel safe going back to the place where you live?  Yes (Son in agreement with patient returning to [facility]) Need for family participation in patient care:  Yes (Comment)  Care giving concerns:  None expressed by son Algeriarlando regarding care at skilled facility   Social Worker assessment / plan:  CSW talked by phone with son Rogers SeedsOrlando Furnari regarding discharge planning and son confirmed that [facility].  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  Patient/Family's Understanding of and Emotional Response to Diagnosis, Current Treatment, and Prognosis:  Not  discussed.  Emotional Assessment Appearance:  Other (Comment Required (Did not view patient, talked to son by phone) Attitude/Demeanor/Rapport:  Unable to Assess Affect (typically observed):  Unable to Assess Orientation:    Alcohol / Substance use:  Other (Tobacco, alcohol and drug use unknown) Psych involvement (Current and /or in the community):  No (Comment)  Discharge Needs  Concerns to be addressed:  Discharge Planning Concerns Readmission within the last 30 days:  No Current discharge risk:  None Barriers to Discharge:  No Barriers Identified   Cristobal GoldmannCrawford, Yeslin Delio Bradley, LCSW 11/30/2015, 2:19 PM

## 2015-11-30 NOTE — Clinical Social Work Note (Signed)
Patient discharging back [facility] today, transported by ambulance. Discharge clinicals transmitted to facility and patient's son Rogers SeedsOrlando Hosman contacted [phone]) and advised of discharge.  Genelle BalVanessa Marques Ericson, MSW, LCSW Licensed Clinical Social Worker Clinical Social Work Department [facility] [phone]

## 2015-12-02 LAB — CULTURE, BLOOD (ROUTINE X 2)
CULTURE: NO GROWTH
Culture: NO GROWTH

## 2015-12-20 ENCOUNTER — Ambulatory Visit (INDEPENDENT_AMBULATORY_CARE_PROVIDER_SITE_OTHER): Payer: Medicare Other | Admitting: Adult Health

## 2015-12-20 ENCOUNTER — Encounter: Payer: Self-pay | Admitting: Adult Health

## 2015-12-20 VITALS — BP 140/80 | HR 95 | Temp 98.1°F | Ht 66.0 in | Wt 106.0 lb

## 2015-12-20 DIAGNOSIS — J449 Chronic obstructive pulmonary disease, unspecified: Secondary | ICD-10-CM | POA: Diagnosis not present

## 2015-12-20 DIAGNOSIS — J189 Pneumonia, unspecified organism: Secondary | ICD-10-CM | POA: Diagnosis not present

## 2015-12-20 NOTE — Assessment & Plan Note (Signed)
COPD on chronic steroids /O2 -presumed severe  Discussed spirometry today, despite encouragement , pt declines to try .  Check cxr  Continue currrent nebs and prednisone 5mg  with home O2

## 2015-12-20 NOTE — Progress Notes (Signed)
Subjective:    Patient ID: Steven Pope, male    DOB: 01-Oct-1934, 81 y.o.   MRN: [id number]  HPI 81 year old male, 81 year old male nursing home patient with Dementia previously a [facility] [place] with [facility] [place]. Seen for pulmonary consult during hospitalization 11/28/2015 for HCAP and Sepsis.   12/20/2015 post hospital follow-up Patient returns for a post hospital follow-up Patient was recently admitted to the hospital 11/27/2015. He was recently moved [place] nursing [place] to [facility] [place]. Patient presented to the emergency room unresponsive. He was found to have sepsis due to healthcare associated pneumonia. She has underlying dementia. Has COPD with interstitial lung disease on chronic prednisone 5mg  daily  and oxygen 2L/M at baseline. . Has a history of failure to thrive and severe protein calorie malnourishment . He has some initial hypotension and acute on chronic kidney failure that improved with IV fluid resuscitation. He did have a increase in his troponin felt to be from demand ischemia. 2-D echo showed normal ejection fracture without wall motion abnormalities. CXR showed LLL and RUL aspdz.  He was treated with antibiotics, steroids and IVF . Discharged on Levaquin  Since discharge he is feeling better. Cough and congestion are better. Has lingering cough with white mucus .  On pulmicort neb Twice daily  And duoneb Four times a day   He is back on  Prednisone 5mg  daily  Remains on O2 at 2lm . Denies chest pain, orthopnea, edema or feve.r     .  Past Medical History  Diagnosis Date  . COPD (chronic obstructive pulmonary disease) (HCC)   . Diabetes mellitus without complication (HCC)   . Syphilis   . Alzheimer disease   . Pulmonary fibrosis (HCC)   . Osteoarthritis   . Cough   . Hypertension   . Constipation     Current Outpatient Prescriptions on File Prior to Visit  Medication Sig Dispense Refill  . acetaminophen (TYLENOL) 325 MG tablet Take 650 mg by mouth every 6 (six) hours as needed for fever.    Marland Kitchen. amLODipine (NORVASC) 5 MG tablet Take 1 tablet (5 mg total) by mouth daily. 30 tablet 0  . aspirin EC 81 MG tablet Take 1 tablet (81 mg total) by mouth daily. 30 tablet 0  . atorvastatin (LIPITOR) 40 MG tablet Take 40 mg by mouth every morning.    . budesonide (PULMICORT) 0.5 MG/2ML nebulizer solution Take 0.5 mg by nebulization every 12 (twelve) hours. Reported on 12/20/2015    . cetirizine (ZYRTEC) 10 MG tablet Take 10 mg by mouth every morning.    Marland Kitchen. dextromethorphan-guaiFENesin (ROBITUSSIN-DM) 10-100 MG/5ML liquid Take 10 mLs by mouth every 6 (six) hours as needed for cough.    . feeding supplement, GLUCERNA SHAKE, (GLUCERNA SHAKE) LIQD Take 237 mLs by mouth every evening. Strawberry    . gabapentin (NEURONTIN) 100 MG capsule Take 100 mg by mouth 3 (three) times daily.    Marland Kitchen. glucagon (GLUCAGON EMERGENCY) 1 MG injection Inject 1 mg into the vein once as needed (for hypoglycemia (BS <60 mg/dl) if patient unable to swallow or unconscious).    Marland Kitchen. guaiFENesin (MUCINEX) 600 MG 12 hr tablet Take 1 tablet (600 mg total) by mouth 2 (two) times daily. 30 tablet 0  . insulin aspart (NOVOLOG FLEXPEN) 100 UNIT/ML FlexPen Inject 0-10 Units into the skin 4 (four) times daily -  before meals and at bedtime. 201 - 250 = 2 units 251 - 300 = 4 units 301 - 350 = 6 units 351 - 400 = 8 units If BS < 60, call MD If BS > 400, give 10 units and call MD    . ipratropium-albuterol (DUONEB) 0.5-2.5 (3) MG/3ML SOLN Take 3 mLs by nebulization every 6 (six) hours. 3 ml inhale orally every 6 hours related to COPD exacerbation.  Rinse mouth with water.  Do not swallow.    . metFORMIN (GLUCOPHAGE) 500 MG tablet Take 500 mg by mouth 2 (two) times daily with a meal.    . metoprolol tartrate (LOPRESSOR) 25 MG tablet Take 1 tablet (25  mg total) by mouth 2 (two) times daily. 60 tablet 0  . Multiple Vitamin (MULTIVITAMIN) tablet Take 1 tablet by mouth every morning.    . OXYGEN Inhale 2 L into the lungs continuous.    . predniSONE (DELTASONE) 5 MG tablet Take  daily for 3days, then resume 5 mg daily. 60 tablet 0  . sennosides-docusate sodium (SENOKOT-S) 8.6-50 MG tablet Take 1 tablet by mouth 2 (two) times daily.    . sorbitol 70 % solution Take 30 mLs by mouth daily as needed (for constipation).     No current facility-administered medications on file prior to visit.     Review of Systems Constitutional:   No  weight loss, night sweats,  Fevers, chills, + fatigue, or  lassitude.  HEENT:   No headaches,  Difficulty swallowing,  Tooth/dental problems, or  Sore throat,                No sneezing, itching, ear ache, nasal congestion, post nasal drip,   CV:  No chest pain,  Orthopnea, PND, swelling in lower extremities, anasarca, dizziness, palpitations, syncope.   GI  No heartburn, indigestion, abdominal pain, nausea, vomiting, diarrhea, change in bowel habits, loss of appetite, bloody stools.   Resp:   No excess mucus, no productive cough,  No non-productive cough,  No coughing up of blood.  No change in color of mucus.  No wheezing.  No chest wall deformity  Skin: no rash or lesions.  GU: no dysuria, change in color of urine, no urgency or frequency.  No flank pain, no hematuria   MS:  No joint pain or swelling.  No decreased range of motion.  No back pain.  Psych:  No change in mood or affect. No depression or anxiety.  + memory loss.         Objective:   Physical Exam Filed Vitals:   12/20/15 1007  BP: 140/80  Pulse: 95  Temp: 98.1 F (36.7 C)  TempSrc: Oral  Height:  (1.676 m)  Weight: 106 lb (48.081 kg)  SpO2: 98%     GEN: A/Ox3; pleasant , NAD, thin and frail   HEENT:  Mena/AT,  EACs-clear, TMs-wnl, NOSE-clear, THROAT-clear, no lesions, no postnasal drip or exudate noted.   NECK:   Supple w/ fair ROM; no JVD; normal carotid impulses w/o bruits; no thyromegaly or nodules palpated; no lymphadenopathy.  RESP  Decreased BS in bases , no accessory muscle use, no dullness to percussion  CARD:  RRR, no m/r/g  , no peripheral edema, pulses intact, no cyanosis or clubbing.  GI:   Soft & nt; nml bowel sounds; no organomegaly or masses detected.  Musco: Warm bil, no deformities or joint swelling noted.   Neuro: alert, no focal deficits noted.    Skin: Warm,  no lesions or rashes    Tammy Parrett NP-C  [facility]  12/20/2015

## 2015-12-20 NOTE — Patient Instructions (Signed)
Continue on current regimen  Check chest xray today  Follow up with DeDios in 3 months and As needed

## 2015-12-20 NOTE — Assessment & Plan Note (Signed)
Recent admission with sepsis /HCAP -clinically improved with abx and IVF  Check cxr today

## 2016-03-26 ENCOUNTER — Encounter: Payer: Self-pay | Admitting: Pulmonary Disease

## 2016-03-26 ENCOUNTER — Ambulatory Visit (INDEPENDENT_AMBULATORY_CARE_PROVIDER_SITE_OTHER): Payer: Medicare Other | Admitting: Pulmonary Disease

## 2016-03-26 VITALS — BP 112/74 | HR 117 | Ht 66.0 in | Wt 110.0 lb

## 2016-03-26 DIAGNOSIS — R0902 Hypoxemia: Secondary | ICD-10-CM | POA: Insufficient documentation

## 2016-03-26 DIAGNOSIS — G309 Alzheimer's disease, unspecified: Secondary | ICD-10-CM | POA: Diagnosis not present

## 2016-03-26 DIAGNOSIS — J189 Pneumonia, unspecified organism: Secondary | ICD-10-CM

## 2016-03-26 DIAGNOSIS — F0280 Dementia in other diseases classified elsewhere without behavioral disturbance: Secondary | ICD-10-CM

## 2016-03-26 DIAGNOSIS — J449 Chronic obstructive pulmonary disease, unspecified: Secondary | ICD-10-CM

## 2016-03-26 NOTE — Assessment & Plan Note (Signed)
Patient with at least 50-pack-year smoking history. [place]oved [place]. At least moderate COPD. On oxygen, 2 L 24 7. Recent admission in June 2017 for pneumonia and COPD exacerbation. Currently stays at a nursing facility. Supposed to be using Dulera 100/4.5 2 puffs twice a day. He states he only uses it once a day. Using DuoNeb 4 times a day. On chronic prednisone 5 mg daily.  Plan : We discussed the importance of taking medications regularly.   Patient will need : Chest X Ray PA-L > may need Chest ct scan if with persistent LLL infiltrate. Threre was concern for ILD.  6MWT > hold off. On 2L 24/7. Not very mobile. Wheelchair bound.  Alpha one level today.   Continue with the following medications: -- Dulera 100 mcg 2P BID and Duoneb 4x/d. Pt is also on prednisone 5 mg/d since 2016.  Patient was instructed to rinse mouth each time he/she uses Dulera Cont O2 2L 24/7.   Influenza vaccine -- received per MAR Pneumococcal vaccine -- receievd in 2016.   Patient was instructed to call the office if he/she has issues with the medications.   Patient was instructed to call the office if he/she is having issues with shortness of breath (i.e. COPD exacerbation).   Patient was instructed to call the office if with increased shortness of breath, cough, sputum production, fevers. Patient may need to be seen at the office for work-up.   Counseled patient on the importance of NOT smoking.

## 2016-03-26 NOTE — Progress Notes (Signed)
Subjective:    Patient ID: Steven Pope, male    DOB: 10/18/1934, 81 y.o.   MRN: [id number]  HPI Patient was admitted in June 2017 for altered mental status. Based on discharge summary, he has COPD, on oxygen, also has Alzheimer's dementia. He was treated as a sepsis, possibly healthcare associated pneumonia. Patient allegedly on prednisone, 5 mg daily. There was also concern for interstitial lung disease. He recently moved from [place]. Improved clinically and sent back to nursing home.   ROV 03/26/16 Patient followed up with Tammy in July. He appeared to be stable during that visit. Was at baseline. Since last seen at the office, he is at baseline. On and off cough, nonproductive. He uses the [facility] once a day he states. He uses nebulizer with DuoNeb 4 times a day.  Patient denies smoking. He has at least 50-pack-year smoking history. He moves around with a wheelchair. Uses oxygen 2 L 24 7. Denies sleep issues. Denies recent hospitalization.    Review of Systems  Constitutional: Negative.   HENT: Negative.   Eyes: Negative.   Respiratory: Positive for cough and shortness of breath. Negative for choking.   Cardiovascular: Negative.   Gastrointestinal: Negative.   Endocrine: Negative.   Genitourinary: Negative.   Musculoskeletal: Negative.   Skin: Negative.   Allergic/Immunologic: Negative.   Neurological: Negative.   Hematological: Negative.   Psychiatric/Behavioral: Negative.   All other systems reviewed and are negative.  Past Medical History:  Diagnosis Date  . Alzheimer disease   . Constipation   . COPD (chronic obstructive pulmonary disease) (HCC)   . Cough   . Diabetes mellitus without complication (HCC)   . Hypertension   . Osteoarthritis   . Pulmonary fibrosis (HCC)   . Syphilis      No family history on file.   Past Surgical History:  Procedure Laterality Date  . CATARACT EXTRACTION      Social History   Social History  . Marital status: Legally  Separated    Spouse name: N/A  . Number of children: N/A  . Years of education: N/A   Occupational History  . Not on file.   Social History Main Topics  . Smoking status: Former Smoker    Types: Cigarettes    Quit date: 07/27/2005  . Smokeless tobacco: Never Used  . Alcohol use No  . Drug use: Unknown  . Sexual activity: Not on file   Other Topics Concern  . Not on file   Social History Narrative  . No narrative on file     No Known Allergies   Outpatient Medications Prior to Visit  Medication Sig Dispense Refill  . acetaminophen (TYLENOL) 325 MG tablet Take 650 mg by mouth every 6 (six) hours as needed for fever.    Marland Kitchen amLODipine (NORVASC) 5 MG tablet Take 1 tablet (5 mg total) by mouth daily. 30 tablet 0  . aspirin EC 81 MG tablet Take 1 tablet (81 mg total) by mouth daily. 30 tablet 0  . atorvastatin (LIPITOR) 40 MG tablet Take 40 mg by mouth every morning.    . bromfenac (XIBROM) 0.09 % ophthalmic solution Place 1 drop into the left eye every morning.    . budesonide (PULMICORT) 0.5 MG/2ML nebulizer solution Take 0.5 mg by nebulization every 12 (twelve) hours. Reported on 12/20/2015    . cetirizine (ZYRTEC) 10 MG tablet Take 10 mg by mouth every morning.    Marland Kitchen dextromethorphan-guaiFENesin (ROBITUSSIN-DM) 10-100 MG/5ML liquid Take 10 mLs by  mouth every 6 (six) hours as needed for cough.    . feeding supplement, GLUCERNA SHAKE, (GLUCERNA SHAKE) LIQD Take 237 mLs by mouth every evening. Strawberry    . gabapentin (NEURONTIN) 100 MG capsule Take 100 mg by mouth 3 (three) times daily.    Marland Kitchen glucagon (GLUCAGON EMERGENCY) 1 MG injection Inject 1 mg into the vein once as needed (for hypoglycemia (BS <60 mg/dl) if patient unable to swallow or unconscious).    Marland Kitchen guaiFENesin (MUCINEX) 600 MG 12 hr tablet Take 1 tablet (600 mg total) by mouth 2 (two) times daily. 30 tablet 0  . insulin aspart (NOVOLOG FLEXPEN) 100 UNIT/ML FlexPen Inject 0-10 Units into the skin 4 (four) times daily -   before meals and at bedtime. 201 - 250 = 2 units 251 - 300 = 4 units 301 - 350 = 6 units 351 - 400 = 8 units If BS < 60, call MD If BS > 400, give 10 units and call MD    . ipratropium-albuterol (DUONEB) 0.5-2.5 (3) MG/3ML SOLN Take 3 mLs by nebulization every 6 (six) hours. 3 ml inhale orally every 6 hours related to COPD exacerbation.  Rinse mouth with water.  Do not swallow.    . metFORMIN (GLUCOPHAGE) 500 MG tablet Take 500 mg by mouth 2 (two) times daily with a meal.    . metoprolol tartrate (LOPRESSOR) 25 MG tablet Take 1 tablet (25 mg total) by mouth 2 (two) times daily. 60 tablet 0  . mirtazapine (REMERON SOL-TAB) 15 MG disintegrating tablet Take 15 mg by mouth at bedtime.    . Multiple Vitamin (MULTIVITAMIN) tablet Take 1 tablet by mouth every morning.    . OXYGEN Inhale 2 L into the lungs continuous.    . sennosides-docusate sodium (SENOKOT-S) 8.6-50 MG tablet Take 1 tablet by mouth 2 (two) times daily.    . sorbitol 70 % solution Take 30 mLs by mouth daily as needed (for constipation).    Marland Kitchen levofloxacin (LEVAQUIN) 750 MG tablet Take 750 mg by mouth daily. Every Thursday and Saturday    . predniSONE (DELTASONE) 5 MG tablet Take 10mg  daily for 3days, then resume 5 mg daily. (Patient not taking: Reported on 03/26/2016) 60 tablet 0   No facility-administered medications prior to visit.    Meds ordered this encounter  Medications  . predniSONE (DELTASONE) 5 MG tablet    Sig: Take 5 mg by mouth daily with breakfast.         Objective:   Physical Exam  Vitals:  Vitals:   03/26/16 1004  BP: 112/74  Pulse: (!) 117  SpO2: 95%  Weight: 110 lb (49.9 kg)  Height: 5\' 6"  (1.676 m)    Constitutional/General:  Pleasant, well-nourished, well-developed, not in any distress,  Comfortably seating.  Well kempt  Body mass index is 17.75 kg/m. Wt Readings from Last 3 Encounters:  03/26/16 110 lb (49.9 kg)  12/20/15 106 lb (48.1 kg)  11/29/15 111 lb (50.3 kg)    Neck  circumference:   HEENT: Pupils equal and reactive to light and accommodation. Anicteric sclerae. Normal nasal mucosa.   No oral  lesions,  mouth clear,  oropharynx clear, no postnasal drip. (-) Oral thrush. No dental caries.  Airway - Mallampati class III  Neck: No masses. Midline trachea. No JVD, (-) LAD. (-) bruits appreciated.  Respiratory/Chest: Grossly normal chest. (-) deformity. (-) Accessory muscle use.  Symmetric expansion. (-) Tenderness on palpation.  Resonant on percussion.  Diminished BS on both lower  lung zones. (-) wheezing, crackles, rhonchi (-) egophony  Cardiovascular: Regular rate and  rhythm, heart sounds normal, no murmur or gallops, no peripheral edema  Gastrointestinal:  Normal bowel sounds. Soft, non-tender. No hepatosplenomegaly.  (-) masses.   Musculoskeletal:  Normal muscle tone. Normal gait.   Extremities: Grossly normal. (-) clubbing, cyanosis.  (-) edema  Skin: (-) rash,lesions seen.   Neurological/Psychiatric : alert, oriented to time, place, person. Normal mood and affect          Assessment & Plan:  COPD (chronic obstructive pulmonary disease) (HCC) Patient with at least 50-pack-year smoking history. Moved [place] to [place] in 2016. At least moderate COPD. On oxygen, 2 L 24 7. Recent admission in June 2017 for pneumonia and COPD exacerbation. Currently stays at a nursing facility. Supposed to be using Dulera 100/4.5 2 puffs twice a day. He states he only uses it once a day. Using DuoNeb 4 times a day. On chronic prednisone 5 mg daily.  Plan : We discussed the importance of taking medications regularly.   Patient will need : Chest X Ray PA-L > may need Chest ct scan if with persistent LLL infiltrate. Threre was concern for ILD.  6MWT > hold off. On 2L 24/7. Not very mobile. Wheelchair bound.  Alpha one level today.   Continue with the following medications: -- Dulera 100 mcg 2P BID and Duoneb 4x/d. Pt is also on  prednisone 5 mg/d since 2016.  Patient was instructed to rinse mouth each time he/she uses Dulera Cont O2 2L 24/7.   Influenza vaccine -- received per MAR Pneumococcal vaccine -- receievd in 2016.   Patient was instructed to call the office if he/she has issues with the medications.   Patient was instructed to call the office if he/she is having issues with shortness of breath (i.e. COPD exacerbation).   Patient was instructed to call the office if with increased shortness of breath, cough, sputum production, fevers. Patient may need to be seen at the office for work-up.   Counseled patient on the importance of NOT smoking.     Hypoxemia Cont O2 2L 24/7.   HCAP (healthcare-associated pneumonia) Patient admitted in June 2017 with sepsis and pneumonia. Clinically improved with Levaquin. Plan for repeat chest x-ray. May need chest CT scan if abnormal.  Alzheimer disease Stable.   Return to clinic in 4 months  J. Alexis FrockAngelo A de Dios, MD 03/26/2016, 10:38 AM [facility] Pager [phone] After 3 pm or if no answer, call [phone]

## 2016-03-26 NOTE — Patient Instructions (Addendum)
It was a pleasure taking care of you today!  Patient will need PFT and chest x-ray scheduled.  You are diagnosed with Chronic Obstructive Pulmonary Disease or COPD.  COPD is a preventable and treatable disease that makes it difficult to empty air out of the lungs (airflow obstruction).  This can lead to shortness of breath.   Sometimes, when you have a lung infection, this can make your breathing worse, and will cause you to have a COPD flare-up or an acute exacerbation of COPD. Please call your primary care doctor or the office if you are having a COPD flare-up.   Smoking makes COPD worse.   Make sure you use your medications for COPD -- Maintenance medications : Dulera > please make sure he uses this 2 puffs twice a day. He mentions, he only uses this once a day. Continue with Duoneb 4x/day  Rescue medications: DuoNeb, via  nebulizer, every 4 hours as needed for dyspnea.  Please rinse your mouth each time you use your maintenance medication.  Please call the office if you are having issues with your medications  Return to clinic in 4 months.

## 2016-03-26 NOTE — Assessment & Plan Note (Signed)
Patient admitted in June 2017 with sepsis and pneumonia. Clinically improved with Levaquin. Plan for repeat chest x-ray. May need chest CT scan if abnormal.

## 2016-03-26 NOTE — Assessment & Plan Note (Signed)
Stable

## 2016-03-26 NOTE — Assessment & Plan Note (Signed)
Cont O2 2L 24/7.

## 2016-07-11 ENCOUNTER — Inpatient Hospital Stay (HOSPITAL_COMMUNITY)
Admission: EM | Admit: 2016-07-11 | Discharge: 2016-07-15 | DRG: 871 | Disposition: A | Discharge status: Skilled Nursing Facility | Payer: Medicare Other | Attending: Internal Medicine | Admitting: Internal Medicine

## 2016-07-11 ENCOUNTER — Emergency Department (HOSPITAL_COMMUNITY): Payer: Medicare Other

## 2016-07-11 ENCOUNTER — Encounter (HOSPITAL_COMMUNITY): Payer: Self-pay | Admitting: Emergency Medicine

## 2016-07-11 DIAGNOSIS — G308 Other Alzheimer's disease: Secondary | ICD-10-CM | POA: Diagnosis not present

## 2016-07-11 DIAGNOSIS — I10 Essential (primary) hypertension: Secondary | ICD-10-CM | POA: Diagnosis not present

## 2016-07-11 DIAGNOSIS — Z681 Body mass index (BMI) 19 or less, adult: Secondary | ICD-10-CM

## 2016-07-11 DIAGNOSIS — A419 Sepsis, unspecified organism: Principal | ICD-10-CM | POA: Diagnosis present

## 2016-07-11 DIAGNOSIS — G9341 Metabolic encephalopathy: Secondary | ICD-10-CM | POA: Diagnosis present

## 2016-07-11 DIAGNOSIS — F0280 Dementia in other diseases classified elsewhere without behavioral disturbance: Secondary | ICD-10-CM

## 2016-07-11 DIAGNOSIS — E785 Hyperlipidemia, unspecified: Secondary | ICD-10-CM | POA: Diagnosis present

## 2016-07-11 DIAGNOSIS — I4581 Long QT syndrome: Secondary | ICD-10-CM | POA: Diagnosis present

## 2016-07-11 DIAGNOSIS — Z87891 Personal history of nicotine dependence: Secondary | ICD-10-CM | POA: Diagnosis not present

## 2016-07-11 DIAGNOSIS — E43 Unspecified severe protein-calorie malnutrition: Secondary | ICD-10-CM | POA: Diagnosis present

## 2016-07-11 DIAGNOSIS — G9340 Encephalopathy, unspecified: Secondary | ICD-10-CM | POA: Diagnosis present

## 2016-07-11 DIAGNOSIS — J189 Pneumonia, unspecified organism: Secondary | ICD-10-CM

## 2016-07-11 DIAGNOSIS — E118 Type 2 diabetes mellitus with unspecified complications: Secondary | ICD-10-CM | POA: Insufficient documentation

## 2016-07-11 DIAGNOSIS — Z79899 Other long term (current) drug therapy: Secondary | ICD-10-CM | POA: Diagnosis not present

## 2016-07-11 DIAGNOSIS — E119 Type 2 diabetes mellitus without complications: Secondary | ICD-10-CM | POA: Diagnosis present

## 2016-07-11 DIAGNOSIS — G309 Alzheimer's disease, unspecified: Secondary | ICD-10-CM | POA: Diagnosis present

## 2016-07-11 DIAGNOSIS — J9610 Chronic respiratory failure, unspecified whether with hypoxia or hypercapnia: Secondary | ICD-10-CM | POA: Diagnosis not present

## 2016-07-11 DIAGNOSIS — J9621 Acute and chronic respiratory failure with hypoxia: Secondary | ICD-10-CM | POA: Diagnosis present

## 2016-07-11 DIAGNOSIS — Z7952 Long term (current) use of systemic steroids: Secondary | ICD-10-CM

## 2016-07-11 DIAGNOSIS — I248 Other forms of acute ischemic heart disease: Secondary | ICD-10-CM | POA: Diagnosis present

## 2016-07-11 DIAGNOSIS — J8410 Pulmonary fibrosis, unspecified: Secondary | ICD-10-CM | POA: Diagnosis not present

## 2016-07-11 DIAGNOSIS — Z794 Long term (current) use of insulin: Secondary | ICD-10-CM

## 2016-07-11 DIAGNOSIS — J449 Chronic obstructive pulmonary disease, unspecified: Secondary | ICD-10-CM | POA: Diagnosis present

## 2016-07-11 DIAGNOSIS — I444 Left anterior fascicular block: Secondary | ICD-10-CM | POA: Diagnosis present

## 2016-07-11 DIAGNOSIS — R0602 Shortness of breath: Secondary | ICD-10-CM

## 2016-07-11 DIAGNOSIS — R0603 Acute respiratory distress: Secondary | ICD-10-CM | POA: Insufficient documentation

## 2016-07-11 DIAGNOSIS — R4182 Altered mental status, unspecified: Secondary | ICD-10-CM | POA: Diagnosis present

## 2016-07-11 DIAGNOSIS — J440 Chronic obstructive pulmonary disease with acute lower respiratory infection: Secondary | ICD-10-CM | POA: Diagnosis present

## 2016-07-11 DIAGNOSIS — D72829 Elevated white blood cell count, unspecified: Secondary | ICD-10-CM

## 2016-07-11 HISTORY — DX: Pneumonia, unspecified organism: J18.9

## 2016-07-11 HISTORY — DX: Chronic respiratory failure, unspecified whether with hypoxia or hypercapnia: J96.10

## 2016-07-11 LAB — I-STAT ARTERIAL BLOOD GAS, ED
ACID-BASE EXCESS: 9 mmol/L — AB (ref 0.0–2.0)
Bicarbonate: 37.5 mmol/L — ABNORMAL HIGH (ref 20.0–28.0)
O2 Saturation: 92 %
PH ART: 7.361 (ref 7.350–7.450)
TCO2: 39 mmol/L (ref 0–100)
pCO2 arterial: 66 mmHg (ref 32.0–48.0)
pO2, Arterial: 69 mmHg — ABNORMAL LOW (ref 83.0–108.0)

## 2016-07-11 LAB — URINALYSIS, ROUTINE W REFLEX MICROSCOPIC
Bilirubin Urine: NEGATIVE
GLUCOSE, UA: 50 mg/dL — AB
Ketones, ur: 5 mg/dL — AB
NITRITE: NEGATIVE
Protein, ur: 100 mg/dL — AB
SPECIFIC GRAVITY, URINE: 1.027 (ref 1.005–1.030)
Squamous Epithelial / LPF: NONE SEEN
pH: 5 (ref 5.0–8.0)

## 2016-07-11 LAB — COMPREHENSIVE METABOLIC PANEL
ALK PHOS: 87 U/L (ref 38–126)
ALT: 103 U/L — ABNORMAL HIGH (ref 17–63)
AST: 146 U/L — ABNORMAL HIGH (ref 15–41)
Albumin: 3 g/dL — ABNORMAL LOW (ref 3.5–5.0)
Anion gap: 14 (ref 5–15)
BUN: 28 mg/dL — AB (ref 6–20)
CALCIUM: 10.2 mg/dL (ref 8.9–10.3)
CO2: 32 mmol/L (ref 22–32)
CREATININE: 1 mg/dL (ref 0.61–1.24)
Chloride: 92 mmol/L — ABNORMAL LOW (ref 101–111)
Glucose, Bld: 272 mg/dL — ABNORMAL HIGH (ref 65–99)
Potassium: 4.7 mmol/L (ref 3.5–5.1)
SODIUM: 138 mmol/L (ref 135–145)
Total Bilirubin: 1 mg/dL (ref 0.3–1.2)
Total Protein: 8.3 g/dL — ABNORMAL HIGH (ref 6.5–8.1)

## 2016-07-11 LAB — GLUCOSE, CAPILLARY
GLUCOSE-CAPILLARY: 175 mg/dL — AB (ref 65–99)
GLUCOSE-CAPILLARY: 172 mg/dL — AB (ref 65–99)

## 2016-07-11 LAB — CBC WITH DIFFERENTIAL/PLATELET
BASOS PCT: 0 %
Basophils Absolute: 0 10*3/uL (ref 0.0–0.1)
EOS ABS: 0 10*3/uL (ref 0.0–0.7)
Eosinophils Relative: 0 %
HCT: 42.2 % (ref 39.0–52.0)
HEMOGLOBIN: 13.5 g/dL (ref 13.0–17.0)
Lymphocytes Relative: 2 %
Lymphs Abs: 0.6 10*3/uL — ABNORMAL LOW (ref 0.7–4.0)
MCH: 28.2 pg (ref 26.0–34.0)
MCHC: 32 g/dL (ref 30.0–36.0)
MCV: 88.1 fL (ref 78.0–100.0)
MONOS PCT: 6 %
Monocytes Absolute: 1.8 10*3/uL — ABNORMAL HIGH (ref 0.1–1.0)
NEUTROS PCT: 92 %
Neutro Abs: 28 10*3/uL — ABNORMAL HIGH (ref 1.7–7.7)
PLATELETS: 180 10*3/uL (ref 150–400)
RBC: 4.79 MIL/uL (ref 4.22–5.81)
RDW: 13.7 % (ref 11.5–15.5)
WBC: 30.5 10*3/uL — ABNORMAL HIGH (ref 4.0–10.5)

## 2016-07-11 LAB — I-STAT CG4 LACTIC ACID, ED: Lactic Acid, Venous: 3.93 mmol/L (ref 0.5–1.9)

## 2016-07-11 LAB — APTT: aPTT: 39 seconds — ABNORMAL HIGH (ref 24–36)

## 2016-07-11 LAB — CBG MONITORING, ED
Glucose-Capillary: 281 mg/dL — ABNORMAL HIGH (ref 65–99)
Glucose-Capillary: 230 mg/dL — ABNORMAL HIGH (ref 65–99)
Glucose-Capillary: 169 mg/dL — ABNORMAL HIGH (ref 65–99)

## 2016-07-11 LAB — PROCALCITONIN: PROCALCITONIN: 0.96 ng/mL

## 2016-07-11 LAB — I-STAT TROPONIN, ED: TROPONIN I, POC: 0.03 ng/mL (ref 0.00–0.08)

## 2016-07-11 LAB — INFLUENZA PANEL BY PCR (TYPE A & B)
INFLBPCR: NEGATIVE
Influenza A By PCR: NEGATIVE

## 2016-07-11 LAB — BRAIN NATRIURETIC PEPTIDE: B Natriuretic Peptide: 283.8 pg/mL — ABNORMAL HIGH (ref 0.0–100.0)

## 2016-07-11 LAB — PROTIME-INR
INR: 1.33
PROTHROMBIN TIME: 16.6 s — AB (ref 11.4–15.2)

## 2016-07-11 LAB — LACTIC ACID, PLASMA
LACTIC ACID, VENOUS: 1.5 mmol/L (ref 0.5–1.9)
Lactic Acid, Venous: 1.2 mmol/L (ref 0.5–1.9)

## 2016-07-11 LAB — TROPONIN I
Troponin I: 0.17 ng/mL (ref <0.03)
Troponin I: 0.15 ng/mL (ref <0.03)

## 2016-07-11 LAB — STREP PNEUMONIAE URINARY ANTIGEN: STREP PNEUMO URINARY ANTIGEN: NEGATIVE

## 2016-07-11 MED ORDER — VANCOMYCIN HCL IN DEXTROSE 1-5 GM/200ML-% IV SOLN
1000.0000 mg | Freq: Once | INTRAVENOUS | Status: AC
  Administered 2016-07-11: 1000 mg via INTRAVENOUS
  Filled 2016-07-11: qty 200

## 2016-07-11 MED ORDER — INSULIN ASPART 100 UNIT/ML ~~LOC~~ SOLN
0.0000 [IU] | Freq: Every day | SUBCUTANEOUS | Status: DC
  Administered 2016-07-12 – 2016-07-13 (×2): 2 [IU] via SUBCUTANEOUS

## 2016-07-11 MED ORDER — PIPERACILLIN-TAZOBACTAM 3.375 G IVPB 30 MIN
3.3750 g | Freq: Once | INTRAVENOUS | Status: AC
  Administered 2016-07-11: 3.375 g via INTRAVENOUS
  Filled 2016-07-11: qty 50

## 2016-07-11 MED ORDER — VANCOMYCIN HCL IN DEXTROSE 1-5 GM/200ML-% IV SOLN
1000.0000 mg | INTRAVENOUS | Status: AC
  Administered 2016-07-12 – 2016-07-13 (×2): 1000 mg via INTRAVENOUS
  Filled 2016-07-11 (×3): qty 200

## 2016-07-11 MED ORDER — PIPERACILLIN-TAZOBACTAM 3.375 G IVPB
3.3750 g | Freq: Three times a day (TID) | INTRAVENOUS | Status: AC
  Administered 2016-07-11 – 2016-07-13 (×8): 3.375 g via INTRAVENOUS
  Filled 2016-07-11 (×8): qty 50

## 2016-07-11 MED ORDER — ENOXAPARIN SODIUM 40 MG/0.4ML ~~LOC~~ SOLN
40.0000 mg | SUBCUTANEOUS | Status: DC
  Administered 2016-07-12 – 2016-07-15 (×4): 40 mg via SUBCUTANEOUS
  Filled 2016-07-11 (×5): qty 0.4

## 2016-07-11 MED ORDER — INSULIN ASPART 100 UNIT/ML ~~LOC~~ SOLN
0.0000 [IU] | Freq: Three times a day (TID) | SUBCUTANEOUS | Status: DC
  Administered 2016-07-12: 1 [IU] via SUBCUTANEOUS
  Administered 2016-07-12: 3 [IU] via SUBCUTANEOUS
  Administered 2016-07-12 – 2016-07-13 (×2): 2 [IU] via SUBCUTANEOUS
  Administered 2016-07-13: 5 [IU] via SUBCUTANEOUS
  Administered 2016-07-13: 2 [IU] via SUBCUTANEOUS
  Administered 2016-07-14 (×2): 5 [IU] via SUBCUTANEOUS
  Administered 2016-07-14: 3 [IU] via SUBCUTANEOUS
  Administered 2016-07-15: 5 [IU] via SUBCUTANEOUS

## 2016-07-11 MED ORDER — SODIUM CHLORIDE 0.9% FLUSH
3.0000 mL | Freq: Two times a day (BID) | INTRAVENOUS | Status: DC
  Administered 2016-07-12 – 2016-07-15 (×6): 3 mL via INTRAVENOUS

## 2016-07-11 MED ORDER — ACETAMINOPHEN 325 MG PO TABS
650.0000 mg | ORAL_TABLET | Freq: Four times a day (QID) | ORAL | Status: DC | PRN
Start: 2016-07-11 — End: 2016-07-12

## 2016-07-11 MED ORDER — SODIUM CHLORIDE 0.9 % IV SOLN
INTRAVENOUS | Status: DC
  Administered 2016-07-11: 09:00:00 via INTRAVENOUS

## 2016-07-11 MED ORDER — ASPIRIN EC 81 MG PO TBEC
81.0000 mg | DELAYED_RELEASE_TABLET | Freq: Every day | ORAL | Status: DC
  Administered 2016-07-11 – 2016-07-15 (×5): 81 mg via ORAL
  Filled 2016-07-11 (×5): qty 1

## 2016-07-11 MED ORDER — ATORVASTATIN CALCIUM 40 MG PO TABS
40.0000 mg | ORAL_TABLET | Freq: Every day | ORAL | Status: DC
  Administered 2016-07-11 – 2016-07-15 (×5): 40 mg via ORAL
  Filled 2016-07-11 (×5): qty 1

## 2016-07-11 MED ORDER — MOMETASONE FURO-FORMOTEROL FUM 200-5 MCG/ACT IN AERO
2.0000 | INHALATION_SPRAY | Freq: Two times a day (BID) | RESPIRATORY_TRACT | Status: DC
  Administered 2016-07-11 – 2016-07-13 (×4): 2 via RESPIRATORY_TRACT
  Filled 2016-07-11 (×2): qty 8.8

## 2016-07-11 MED ORDER — GLUCERNA SHAKE PO LIQD: 237.0000 mL | Freq: Every evening | ORAL | Status: DC

## 2016-07-11 MED ORDER — GUAIFENESIN-DM 100-10 MG/5ML PO SYRP
5.0000 mL | ORAL_SOLUTION | ORAL | Status: DC | PRN
  Administered 2016-07-11 – 2016-07-12 (×2): 5 mL via ORAL
  Filled 2016-07-11 (×3): qty 5

## 2016-07-11 MED ORDER — IPRATROPIUM-ALBUTEROL 0.5-2.5 (3) MG/3ML IN SOLN
3.0000 mL | RESPIRATORY_TRACT | Status: AC
  Administered 2016-07-11 (×2): 3 mL via RESPIRATORY_TRACT
  Filled 2016-07-11: qty 3

## 2016-07-11 MED ORDER — SODIUM CHLORIDE 0.9 % IV BOLUS (SEPSIS)
500.0000 mL | Freq: Once | INTRAVENOUS | Status: AC
  Administered 2016-07-11: 500 mL via INTRAVENOUS

## 2016-07-11 MED ORDER — PREDNISONE 5 MG PO TABS
5.0000 mg | ORAL_TABLET | Freq: Every day | ORAL | Status: DC
  Administered 2016-07-12: 5 mg via ORAL
  Filled 2016-07-11: qty 1

## 2016-07-11 MED ORDER — MIRTAZAPINE 15 MG PO TABS
15.0000 mg | ORAL_TABLET | Freq: Every day | ORAL | Status: DC
  Administered 2016-07-11 – 2016-07-14 (×4): 15 mg via ORAL
  Filled 2016-07-11 (×4): qty 1

## 2016-07-11 MED ORDER — ACETAMINOPHEN 650 MG RE SUPP: 650.0000 mg | Freq: Four times a day (QID) | RECTAL | Status: DC | PRN

## 2016-07-11 MED ORDER — GABAPENTIN 100 MG PO CAPS
100.0000 mg | ORAL_CAPSULE | Freq: Three times a day (TID) | ORAL | Status: DC
  Administered 2016-07-12 – 2016-07-15 (×10): 100 mg via ORAL
  Filled 2016-07-11 (×10): qty 1

## 2016-07-11 MED ORDER — IPRATROPIUM-ALBUTEROL 0.5-2.5 (3) MG/3ML IN SOLN
3.0000 mL | Freq: Four times a day (QID) | RESPIRATORY_TRACT | Status: DC
  Administered 2016-07-12: 3 mL via RESPIRATORY_TRACT
  Filled 2016-07-11: qty 3

## 2016-07-11 MED ORDER — SODIUM CHLORIDE 0.9 % IV BOLUS (SEPSIS)
1000.0000 mL | Freq: Once | INTRAVENOUS | Status: AC
  Administered 2016-07-11: 1000 mL via INTRAVENOUS

## 2016-07-11 MED ORDER — IPRATROPIUM-ALBUTEROL 0.5-2.5 (3) MG/3ML IN SOLN
3.0000 mL | RESPIRATORY_TRACT | Status: DC
  Administered 2016-07-11 (×4): 3 mL via RESPIRATORY_TRACT
  Filled 2016-07-11 (×4): qty 3

## 2016-07-11 NOTE — ED Notes (Signed)
Pt did not need anything at this time  

## 2016-07-11 NOTE — ED Notes (Signed)
PT did not wake up for me to obtain UA for Pt. PT seems very tired.

## 2016-07-11 NOTE — ED Provider Notes (Signed)
TIME SEEN: 5:30 AM  CHIEF COMPLAINT: Episode of unresponsiveness  HPI: Pt is a 82 y.o. male with history of diabetes, hypertension, pulmonary fibrosis and COPD, Alzheimer's dementia who presents to the emergency department with an episode of unresponsiveness at his nursing home. Per EMS, patient received a proximally 5 minutes of CPR with nursing home staff. He was not given any medications. On their arrival, patient had a pulse and was breathing on his own and awake, answering questions. Patient complains that he is having some central chest soreness. He has had cough was recently diagnosed with right upper lobe pneumonia by x-ray on 07/10/16. He has been on Levaquin and Tamiflu. He states he is a full code.  ROS: See HPI Constitutional: no fever  Eyes: no drainage  ENT: no runny nose   Cardiovascular:   chest pain  Resp:  SOB  GI: no vomiting GU: no dysuria Integumentary: no rash  Allergy: no hives  Musculoskeletal: no leg swelling  Neurological: no slurred speech ROS otherwise negative  PAST MEDICAL HISTORY/PAST SURGICAL HISTORY:  Past Medical History:  Diagnosis Date  . Alzheimer disease   . Constipation   . COPD (chronic obstructive pulmonary disease) (HCC)   . Cough   . Diabetes mellitus without complication (HCC)   . Hypertension   . Osteoarthritis   . Pulmonary fibrosis (HCC)   . Syphilis     MEDICATIONS:  Prior to Admission medications   Medication Sig Start Date End Date Taking? Authorizing Provider  acetaminophen (TYLENOL) 325 MG tablet Take 650 mg by mouth every 6 (six) hours as needed for fever.    Historical Provider, MD  amLODipine (NORVASC) 5 MG tablet Take 1 tablet (5 mg total) by mouth daily. 11/30/15   Rolly Salter, MD  aspirin EC 81 MG tablet Take 1 tablet (81 mg total) by mouth daily. 11/30/15   Rolly Salter, MD  atorvastatin (LIPITOR) 40 MG tablet Take 40 mg by mouth every morning.    Historical Provider, MD  bromfenac (XIBROM) 0.09 % ophthalmic solution  Place 1 drop into the left eye every morning.    Historical Provider, MD  budesonide (PULMICORT) 0.5 MG/2ML nebulizer solution Take 0.5 mg by nebulization every 12 (twelve) hours. Reported on 12/20/2015    Historical Provider, MD  cetirizine (ZYRTEC) 10 MG tablet Take 10 mg by mouth every morning.    Historical Provider, MD  dextromethorphan-guaiFENesin (ROBITUSSIN-DM) 10-100 MG/5ML liquid Take 10 mLs by mouth every 6 (six) hours as needed for cough.    Historical Provider, MD  feeding supplement, GLUCERNA SHAKE, (GLUCERNA SHAKE) LIQD Take 237 mLs by mouth every evening. Strawberry    Historical Provider, MD  gabapentin (NEURONTIN) 100 MG capsule Take 100 mg by mouth 3 (three) times daily.    Historical Provider, MD  glucagon (GLUCAGON EMERGENCY) 1 MG injection Inject 1 mg into the vein once as needed (for hypoglycemia (BS <60 mg/dl) if patient unable to swallow or unconscious).    Historical Provider, MD  guaiFENesin (MUCINEX) 600 MG 12 hr tablet Take 1 tablet (600 mg total) by mouth 2 (two) times daily. 11/30/15   Rolly Salter, MD  insulin aspart (NOVOLOG FLEXPEN) 100 UNIT/ML FlexPen Inject 0-10 Units into the skin 4 (four) times daily -  before meals and at bedtime. 201 - 250 = 2 units 251 - 300 = 4 units 301 - 350 = 6 units 351 - 400 = 8 units If BS < 60, call MD If BS > 400, give  10 units and call MD    Historical Provider, MD  ipratropium-albuterol (DUONEB) 0.5-2.5 (3) MG/3ML SOLN Take 3 mLs by nebulization every 6 (six) hours. 3 ml inhale orally every 6 hours related to COPD exacerbation.  Rinse mouth with water.  Do not swallow.    Historical Provider, MD  levofloxacin (LEVAQUIN) 750 MG tablet Take 750 mg by mouth daily. Every Thursday and Saturday    Historical Provider, MD  metFORMIN (GLUCOPHAGE) 500 MG tablet Take 500 mg by mouth 2 (two) times daily with a meal.    Historical Provider, MD  metoprolol tartrate (LOPRESSOR) 25 MG tablet Take 1 tablet (25 mg total) by mouth 2 (two) times  daily. 11/30/15   Rolly Salter, MD  mirtazapine (REMERON SOL-TAB) 15 MG disintegrating tablet Take 15 mg by mouth at bedtime.    Historical Provider, MD  Multiple Vitamin (MULTIVITAMIN) tablet Take 1 tablet by mouth every morning.    Historical Provider, MD  OXYGEN Inhale 2 L into the lungs continuous.    Historical Provider, MD  predniSONE (DELTASONE) 5 MG tablet Take 5 mg by mouth daily with breakfast.    Historical Provider, MD  sennosides-docusate sodium (SENOKOT-S) 8.6-50 MG tablet Take 1 tablet by mouth 2 (two) times daily.    Historical Provider, MD  sorbitol 70 % solution Take 30 mLs by mouth daily as needed (for constipation).    Historical Provider, MD    ALLERGIES:  No Known Allergies  SOCIAL HISTORY:  Social History  Substance Use Topics  . Smoking status: Former Smoker    Types: Cigarettes    Quit date: 07/27/2005  . Smokeless tobacco: Never Used  . Alcohol use No    FAMILY HISTORY: No family history on file.  EXAM: BP 125/78 (BP Location: Right Arm)   Pulse 116   Temp 98.4 F (36.9 C) (Rectal)   Resp 26   SpO2 100%  CONSTITUTIONAL: Alert and oriented To person and place but not time and responds appropriately to questions. Elderly, chronically ill-appearing HEAD: Normocephalic EYES: Conjunctivae clear, PERRL, EOMI ENT: normal nose; no rhinorrhea; dry mucous membranes NECK: Supple, no meningismus, no nuchal rigidity, no LAD  CARD: Regular and tachycardic; S1 and S2 appreciated; no murmurs, no clicks, no rubs, no gallops RESP: Patient is tachypneic, on 2 L of oxygen, no hypoxia, diminished at his bases bilaterally with some minimal exotropia wheezes, no rhonchi or rales, a witness equal sentences ABD/GI: Normal bowel sounds; non-distended; soft, non-tender, no rebound, no guarding, no peritoneal signs, no hepatosplenomegaly BACK:  The back appears normal and is non-tender to palpation, there is no CVA tenderness EXT: Normal ROM in all joints; non-tender to  palpation; no edema; normal capillary refill; no cyanosis, no calf tenderness or swelling    SKIN: Normal color for age and race; warm; no rash NEURO: Moves all extremities equally PSYCH: The patient's mood and manner are appropriate. Grooming and personal hygiene are appropriate.  MEDICAL DECISION MAKING: Patient here with recent pneumonia and possible flu had an episode of unresponsiveness and CPR at his nursing facility. It is unclear if he was truly pulseless and not breathing. On EMS arrival patient had a pulse, was breathing on his own and talking. Staff stated he was at his neurologic baseline. Here he complains of some chest soreness which is likely from chest compressions but has no obvious deformity. Denies any other pain. States he feels short of breath and has had cough. Patient reports he is a full code.  ED  PROGRESS: Patient's labs show leukocytosis of 30,000 with left shift. He chronically has a leukocytosis but this has significantly increased from his baseline. Troponin is negative. BNP mildly elevated at 283. His ABG is reassuring and shows a compensated gas with pH of 7.36, PCO2 66, bicarbonate 37.5. Chest x-ray shows bilateral infiltrates consistent with pneumonia. His lactate is elevated. He is receiving his weight-based IV fluids and broad-spectrum antibiotics. Flu swab has also been collected and is pending. We'll discuss with medicine for admission. He is still tachycardic but his respiratory rate has improved as have his breath sounds after 2 duo neb treatments.   7:00 AM  Discussed patient's case with Hospitalist, Dr. Toniann FailKakrakandy.  Recommend admission to inpatient, stepdown bed.  I will place holding orders per their request. Patient and family (if present) updated with plan. Care transferred to hospitalist service.  I reviewed all nursing notes, vitals, pertinent old records, EKGs, labs, imaging (as available).     EKG Interpretation  Date/Time:  Wednesday July 11 2016  05:16:15 EST Ventricular Rate:  116 PR Interval:    QRS Duration: 92 QT Interval:  354 QTC Calculation: 492 R Axis:   -54 Text Interpretation:  Sinus tachycardia Probable left atrial enlargement Left anterior fascicular block Abnormal R-wave progression, late transition LVH with secondary repolarization abnormality Borderline prolonged QT interval Confirmed by Layza Summa,  DO, Keneshia Tena ([id number]) on 07/11/2016 5:26:48 AM        CRITICAL CARE Performed by: Raelyn NumberWARD, Kelin Borum N   Total critical care time: 45 minutes  Critical care time was exclusive of separately billable procedures and treating other patients.  Critical care was necessary to treat or prevent imminent or life-threatening deterioration.  Critical care was time spent personally by me on the following activities: development of treatment plan with patient and/or surrogate as well as nursing, discussions with consultants, evaluation of patient's response to treatment, examination of patient, obtaining history from patient or surrogate, ordering and performing treatments and interventions, ordering and review of laboratory studies, ordering and review of radiographic studies, pulse oximetry and re-evaluation of patient's condition.     Layla MawKristen N Erwin Nishiyama, DO 07/11/16 [phone]

## 2016-07-11 NOTE — H&P (Signed)
History and Physical    Steven Pope WJX:[id number] DOB: 02/02/1935 DOA: 07/11/2016  PCP: [name], MD Patient coming from: facility  Chief Complaint: acute encephalopathy  HPI: Steven Pope is a 82 y.o. male with medical history significant for diabetes, hypertension, Alzheimer's, COPD on oxygen, pulmonary fibrosis presents to the emergency Department chief complaint altered mental status. Initial evaluation reveals of care associated pneumonia  Information is obtained from the chart as patient to acutely ill to participate. Chart review [name] patient found unresponsive at facility this morning. Reportedly he was found pulseless and CPR was initiated and EMS called. Chart indicates that on their arrival patient had a pulse and was breathing on his own and awakening answering questions. In the emergency department he complained of central chest soreness and moist nonproductive cough. Of note he was recently diagnosed with right upper lobe pneumonia by x-ray. Levaquin and Tamiflu were initiated. Reports of recent illness fever chills vomiting diarrhea.   ED Course: In the emergency department is afebrile tachycardia otherwise hemodynamically stable. Oxygen saturation level remained greater than 90% on 2 L which is his baseline.  Review of Systems: As per HPI otherwise 10 point review of systems negative.   Ambulatory Status: unknown  Past Medical History:  Diagnosis Date  . Alzheimer disease   . Chronic respiratory failure (HCC)   . Constipation   . COPD (chronic obstructive pulmonary disease) (HCC)   . Cough   . Diabetes mellitus without complication (HCC)   . Hypertension   . Osteoarthritis   . Pulmonary fibrosis (HCC)   . Syphilis     Past Surgical History:  Procedure Laterality Date  . CATARACT EXTRACTION      Social History   Social History  . Marital status: Legally Separated    Spouse name: N/A  . Number of children: N/A  . Years of education: N/A    Occupational History  . Not on file.   Social History Main Topics  . Smoking status: Former Smoker    Types: Cigarettes    Quit date: 07/27/2005  . Smokeless tobacco: Never Used  . Alcohol use No  . Drug use: Unknown  . Sexual activity: Not on file   Other Topics Concern  . Not on file   Social History Narrative  . No narrative on file    No Known Allergies  No family history on file.  Prior to Admission medications   Medication Sig Start Date End Date Taking? Authorizing Provider  acetaminophen (TYLENOL) 325 MG tablet Take 650 mg by mouth every 6 (six) hours as needed for fever.   Yes Historical Provider, MD  amLODipine (NORVASC) 5 MG tablet Take 1 tablet (5 mg total) by mouth daily. 11/30/15  Yes Rolly Salter, MD  aspirin EC 81 MG tablet Take 1 tablet (81 mg total) by mouth daily. 11/30/15  Yes Rolly Salter, MD  atorvastatin (LIPITOR) 40 MG tablet Take 40 mg by mouth every morning.   Yes Historical Provider, MD  cetirizine (ZYRTEC) 10 MG tablet Take 10 mg by mouth every morning.   Yes Historical Provider, MD  cholecalciferol (VITAMIN D) 1000 units tablet Take 1,000 Units by mouth daily.   Yes Historical Provider, MD  dextromethorphan-guaiFENesin (ROBITUSSIN-DM) 10-100 MG/5ML liquid Take 10 mLs by mouth every 6 (six) hours as needed for cough.   Yes Historical Provider, MD  diclofenac sodium (VOLTAREN) 1 % GEL Apply 1 g topically 2 (two) times daily.   Yes Historical Provider, MD  gabapentin (NEURONTIN) 100 MG capsule Take 100 mg by mouth 3 (three) times daily.   Yes Historical Provider, MD  glucagon (GLUCAGON EMERGENCY) 1 MG injection Inject 1 mg into the vein once as needed (for hypoglycemia (BS <60 mg/dl) if patient unable to swallow or unconscious).   Yes Historical Provider, MD  guaiFENesin (MUCINEX) 600 MG 12 hr tablet Take 1 tablet (600 mg total) by mouth 2 (two) times daily. 11/30/15  Yes Rolly SalterPranav M Patel, MD  insulin aspart (NOVOLOG FLEXPEN) 100 UNIT/ML FlexPen Inject  0-8 Units into the skin 4 (four) times daily -  before meals and at bedtime. 201 - 250 = 2 units 251 - 300 = 4 units 301 - 350 = 6 units 351 - 400 = 8 units If BS < 60, call MD If BS > 400, give 10 units and call MD    Yes Historical Provider, MD  ipratropium-albuterol (DUONEB) 0.5-2.5 (3) MG/3ML SOLN Take 3 mLs by nebulization every 6 (six) hours. 3 ml inhale orally every 6 hours related to COPD exacerbation.  Rinse mouth with water.  Do not swallow.   Yes Historical Provider, MD  levofloxacin (LEVAQUIN) 750 MG tablet Take 750 mg by mouth daily. For seven days   Yes Historical Provider, MD  metFORMIN (GLUCOPHAGE) 500 MG tablet Take 500-1,000 mg by mouth 2 (two) times daily with a meal. Take 2 tablets in the morning and 2 tables in the evening.   Yes Historical Provider, MD  metoprolol tartrate (LOPRESSOR) 25 MG tablet Take 1 tablet (25 mg total) by mouth 2 (two) times daily. 11/30/15  Yes Rolly SalterPranav M Patel, MD  mirtazapine (REMERON) 15 MG tablet Take 15 mg by mouth at bedtime.   Yes Historical Provider, MD  mometasone-formoterol (DULERA) 200-5 MCG/ACT AERO Inhale 2 puffs into the lungs 2 (two) times daily.   Yes Historical Provider, MD  Multiple Vitamin (MULTIVITAMIN) tablet Take 1 tablet by mouth every morning.   Yes Historical Provider, MD  OXYGEN Inhale 2 L into the lungs continuous.   Yes Historical Provider, MD  predniSONE (DELTASONE) 5 MG tablet Take 5 mg by mouth daily with breakfast.   Yes Historical Provider, MD  sennosides-docusate sodium (SENOKOT-S) 8.6-50 MG tablet Take 1 tablet by mouth 2 (two) times daily.   Yes Historical Provider, MD  sorbitol 70 % solution Take 30 mLs by mouth daily as needed (for constipation).   Yes Historical Provider, MD  feeding supplement, GLUCERNA SHAKE, (GLUCERNA SHAKE) LIQD Take 237 mLs by mouth every evening. Strawberry    Historical Provider, MD    Physical Exam: Vitals:   07/11/16 0800 07/11/16 0830 07/11/16 0844 07/11/16 0900  BP: 111/72 108/69   108/66  Pulse: 109 101  102  Resp: 18 18  18   Temp:      TempSrc:      SpO2: 97% 99%  98%  Weight:   49.9 kg (110 lb)      General:  Appears Quite lethargic thin and frail Eyes:  PERRL, EOMI, normal lids, iris ENT:  grossly normal hearing, lips & tongue, his membranes of his mouth are pink but dry Neck:  no LAD, masses or thyromegaly Cardiovascular:  Tachycardic but regular, no m/r/g. No LE edema.  Respiratory:  Mild increased work of breathing. Sounds coarse throughout faint end expiratory wheezing Abdomen:  soft, ntnd, [place] bowel sounds but sluggish Skin:  no rash or induration seen on limited exam Musculoskeletal:  Poor muscle tone BUE/BLE, good ROM, no bony abnormality Psychiatric:  grossly normal mood and affect, speech fluent and appropriate, AOx3 Neurologic:  Lethargic but responds to verbal stimuli. Attempts to follow commands. Moving all extremities.  Labs on Admission: I have personally reviewed following labs and imaging studies  CBC:  Recent Labs Lab 07/11/16 0607  WBC 30.5*  NEUTROABS 28.0*  HGB 13.5  HCT 42.2  MCV 88.1  PLT 180   Basic Metabolic Panel:  Recent Labs Lab 07/11/16 0607  NA 138  K 4.7  CL 92*  CO2 32  GLUCOSE 272*  BUN 28*  CREATININE 1.00  CALCIUM 10.2   GFR: Estimated Creatinine Clearance: 42.3 mL/min (by C-G formula based on SCr of 1 mg/dL). Liver Function Tests:  Recent Labs Lab 07/11/16 0607  AST 146*  ALT 103*  ALKPHOS 87  BILITOT 1.0  PROT 8.3*  ALBUMIN 3.0*   No results for input(s): LIPASE, AMYLASE in the last 168 hours. No results for input(s): AMMONIA in the last 168 hours. Coagulation Profile:  Recent Labs Lab 07/11/16 0753  INR 1.33   Cardiac Enzymes: No results for input(s): CKTOTAL, CKMB, CKMBINDEX, TROPONINI in the last 168 hours. BNP (last 3 results) No results for input(s): PROBNP in the last 8760 hours. HbA1C: No results for input(s): HGBA1C in the last 72 hours. CBG:  Recent Labs Lab  07/11/16 0754  GLUCAP 281*   Lipid Profile: No results for input(s): CHOL, HDL, LDLCALC, TRIG, CHOLHDL, LDLDIRECT in the last 72 hours. Thyroid Function Tests: No results for input(s): TSH, T4TOTAL, FREET4, T3FREE, THYROIDAB in the last 72 hours. Anemia Panel: No results for input(s): VITAMINB12, FOLATE, FERRITIN, TIBC, IRON, RETICCTPCT in the last 72 hours. Urine analysis:    Component Value Date/Time   COLORURINE YELLOW 11/27/2015 2029   APPEARANCEUR CLOUDY (A) 11/27/2015 2029   LABSPEC 1.016 11/27/2015 2029   PHURINE 5.5 11/27/2015 2029   GLUCOSEU NEGATIVE 11/27/2015 2029   HGBUR TRACE (A) 11/27/2015 2029   BILIRUBINUR NEGATIVE 11/27/2015 2029   KETONESUR NEGATIVE 11/27/2015 2029   PROTEINUR 30 (A) 11/27/2015 2029   NITRITE NEGATIVE 11/27/2015 2029   LEUKOCYTESUR NEGATIVE 11/27/2015 2029    Creatinine Clearance: Estimated Creatinine Clearance: 42.3 mL/min (by C-G formula based on SCr of 1 mg/dL).  Sepsis Labs: @LABRCNTIP (procalcitonin:4,lacticidven:4) )No results found for this or any previous visit (from the past 240 hour(s)).   Radiological Exams on Admission: Dg Chest Port 1 View  Result Date: 07/11/2016 CLINICAL DATA:  82 y/o  M; CTR for 5 minutes with return of pulses. EXAM: PORTABLE CHEST 1 VIEW COMPARISON:  11/29/2015 chest radiograph FINDINGS: Coarse reticular markings in the lungs are similar to the prior chest radiograph and are consistent with pulmonary fibrosis. There are superimposed patchy airspace opacities which may represent pulmonary edema or multifocal pneumonia. Stable cardiac silhouette. No pleural effusion. Chronic right lateral rib fractures are present. IMPRESSION: Pulmonary fibrosis. Superimposed patchy airspace opacities may represent pulmonary edema or multifocal pneumonia. Electronically Signed   By: Mitzi Hansen M.D.   On: 07/11/2016 06:19    EKG: Independently reviewed. Sinus tachycardia Probable left atrial enlargement Left anterior  fascicular block Abnormal R-wave progression, late transition LVH with secondary repolarization abnormality  Assessment/Plan Principal Problem:   HCAP (healthcare-associated pneumonia) Active Problems:   Sepsis (HCC)   Alzheimer disease   COPD (chronic obstructive pulmonary disease) (HCC)   Pulmonary fibrosis (HCC)   Essential hypertension   Type 2 diabetes mellitus without complication, without long-term current use of insulin (HCC)   Severe protein-calorie malnutrition (HCC)   Chronic respiratory  failure (HCC)   Acute encephalopathy   1. Healthcare associated pneumonia. Chest x-ray  airspace opacities which may represent pulmonary edema or multifocal pneumonia, leukocytosis, elevated lactic acid, acute encephalopathy -Admit to step down -Follow blood cultures -Follow sputum cultures as able -IV antibiotics per protocol -Continue IV fluids -Trend lactic acid -cbc in am -Titrate oxygen supplementation to keep oxygen saturation level greater than 90%  #2. Chronic respiratory failure in patient with history of pulmonary fibrosis COPD on home oxygen at 2 L. ABG with Ph 7.36, CO2 66 and O2 69. bnp only slightly elevated See #1. -Scheduled nebulizers -home inhalers -continue chronic prednisone  #3. Sepsis. Related to HCAP. Leukocytosis, tachycardia, tachypnea, acute encephalopathy, acidosis. Improving on admission -see above -continue IV fluids -antibiotics as noted above -follow cultures -trend lactic acid -cbc in am  4. Hypertension. Stable in the emergency department. Home medications include amlodipine, Toprol. -cycle troponin -serial ekg -Holding antihypertensives for now -Monitor closely -Resume as indicated  5. Acute encephalopathy. Likely related to above. Improving at admission. Neuro exam benign. -see above  6 diabetes. Serum glucose 272. Home medications include insulin well as glucagon. -Hold oral agents for now -Obtain hemoglobin A1c -Sliding scale for  optimal control  7. Leukocytosis. Review indicates somewhat chronic component current level above his baseline. WBC 30. neut # 28. May be reactive, heme-concentrated in setting of low dose chronic prednisone and dehydration and HCAP. Influenza panel negative -see above -follow blood culture -follow sputum culture -monitor -antibiotics as noted above -recheck in am.     DVT prophylaxis: scd  Code Status: full  Family Communication: none present  Disposition Plan: back to facility Consults called: none  Admission status: inpatient    Gwenyth Bender MD [facility]  If 7PM-7AM, please contact night-coverage [email or web address] Password [email or web address]  07/11/2016, 10:18 AM

## 2016-07-11 NOTE — ED Notes (Signed)
PT is resting

## 2016-07-11 NOTE — Progress Notes (Signed)
  A & P  Elevated troponin. Troponin 0.17. Likely related to demand. Patient denies chest pain. -Continue to cycle.  -repeat ekg in am -if continues to trend up consider cardiology consult  Toya SmothersKaren Josephmichael Lisenbee NP

## 2016-07-11 NOTE — ED Triage Notes (Signed)
Per EMS[facility] per facility pt got up to go to the bathroom, went unresponsive, was pulseless and had agonal respirations. CPR was administered for 5 minutes and pt had return of pulses. Per EMS, pt was unresponsive upon their arrival but was easily aroused. Pt alert and oriented at baseline upon arrival to this ED.Pt is a full code, recent pneumonia diagnosis. CBG-300, BP-142/82, HR-126, SpO2-98% on simple mask.

## 2016-07-11 NOTE — ED Notes (Signed)
PT is still unable to urinate at this time

## 2016-07-11 NOTE — Progress Notes (Signed)
Patient arrived to the unit at 1755 via stretcher from [facility].  Admitted to room 21.  Family accompanied patient.  Patient speaks little [place].  Alert and oriented to self, forgetful.  Patient denies any discomfort at this time.  Oriented to call light use, phone and meal times.  Elnita MaxwellSalome N Maleena Eddleman, RN

## 2016-07-11 NOTE — ED Notes (Signed)
Notified Toya SmothersKaren Black, NP of pt's critical troponin of 0.17.

## 2016-07-11 NOTE — Progress Notes (Signed)
Pharmacy Antibiotic Note  Steven SchwabJuan Martin Pope is a 82 y.o. male admitted on 07/11/2016 with sepsis.  Pharmacy has been consulted for Vancomycin and Zosyn dosing.  Zosyn 3.375gm IV given in ED 0615; Vanc 1gm ordered  Plan: Zosyn 3.375gm IV q8h - doses over 4 hours Vancomycin 1gm IV q24h Will f/u micro data, renal function, and pt's clinical condition Vanc trough prn     Temp (24hrs), Avg:98.4 F (36.9 C), Min:98.4 F (36.9 C), Max:98.4 F (36.9 C)   Recent Labs Lab 07/11/16 0607 07/11/16 0623  WBC 30.5*  --   LATICACIDVEN  --  3.93*    CrCl cannot be calculated (Patient's most recent lab result is older than the maximum 21 days allowed.).    No Known Allergies  Antimicrobials this admission: 2/7 Vanc >>  2/7 Zosyn >>   Dose adjustments this admission: n/a  Microbiology results: 2/7 BCx x2:   Thank you for allowing pharmacy to be a part of this patient's care.  Christoper Fabianaron Bricia Taher, PharmD, BCPS Clinical pharmacist, pager [phone] 07/11/2016 6:39 AM

## 2016-07-12 ENCOUNTER — Inpatient Hospital Stay (HOSPITAL_COMMUNITY): Payer: Medicare Other

## 2016-07-12 ENCOUNTER — Encounter (HOSPITAL_COMMUNITY): Payer: Self-pay | Admitting: General Practice

## 2016-07-12 DIAGNOSIS — J189 Pneumonia, unspecified organism: Secondary | ICD-10-CM

## 2016-07-12 LAB — ECHOCARDIOGRAM COMPLETE
Height: 60 in
WEIGHTICAEL: 1460.8 [oz_av]

## 2016-07-12 LAB — CBC
HEMATOCRIT: 38.6 % — AB (ref 39.0–52.0)
HEMOGLOBIN: 11.9 g/dL — AB (ref 13.0–17.0)
MCH: 27.2 pg (ref 26.0–34.0)
MCHC: 30.8 g/dL (ref 30.0–36.0)
MCV: 88.1 fL (ref 78.0–100.0)
Platelets: 184 10*3/uL (ref 150–400)
RBC: 4.38 MIL/uL (ref 4.22–5.81)
RDW: 13.5 % (ref 11.5–15.5)
WBC: 15.4 10*3/uL — ABNORMAL HIGH (ref 4.0–10.5)

## 2016-07-12 LAB — GLUCOSE, CAPILLARY
GLUCOSE-CAPILLARY: 169 mg/dL — AB (ref 65–99)
GLUCOSE-CAPILLARY: 223 mg/dL — AB (ref 65–99)
Glucose-Capillary: 127 mg/dL — ABNORMAL HIGH (ref 65–99)
Glucose-Capillary: 208 mg/dL — ABNORMAL HIGH (ref 65–99)

## 2016-07-12 LAB — BASIC METABOLIC PANEL
Anion gap: 10 (ref 5–15)
BUN: 16 mg/dL (ref 6–20)
CALCIUM: 8.8 mg/dL — AB (ref 8.9–10.3)
CO2: 33 mmol/L — AB (ref 22–32)
Chloride: 96 mmol/L — ABNORMAL LOW (ref 101–111)
Creatinine, Ser: 0.65 mg/dL (ref 0.61–1.24)
Glucose, Bld: 169 mg/dL — ABNORMAL HIGH (ref 65–99)
POTASSIUM: 3.1 mmol/L — AB (ref 3.5–5.1)
Sodium: 139 mmol/L (ref 135–145)

## 2016-07-12 LAB — TROPONIN I: TROPONIN I: 0.22 ng/mL — AB (ref <0.03)

## 2016-07-12 LAB — HEMOGLOBIN A1C
HEMOGLOBIN A1C: 6.8 % — AB (ref 4.8–5.6)
MEAN PLASMA GLUCOSE: 148 mg/dL

## 2016-07-12 MED ORDER — IOPAMIDOL (ISOVUE-370) INJECTION 76%
INTRAVENOUS | Status: AC
  Administered 2016-07-12: 100 mL
  Filled 2016-07-12: qty 100

## 2016-07-12 MED ORDER — METOPROLOL TARTRATE 25 MG PO TABS
25.0000 mg | ORAL_TABLET | Freq: Two times a day (BID) | ORAL | Status: DC
  Administered 2016-07-12 – 2016-07-15 (×7): 25 mg via ORAL
  Filled 2016-07-12 (×8): qty 1

## 2016-07-12 MED ORDER — SODIUM CHLORIDE 0.9 % IV SOLN
INTRAVENOUS | Status: AC
  Administered 2016-07-12: 1000 mL via INTRAVENOUS

## 2016-07-12 MED ORDER — BENZONATATE 100 MG PO CAPS
200.0000 mg | ORAL_CAPSULE | Freq: Three times a day (TID) | ORAL | Status: DC | PRN
  Administered 2016-07-12 – 2016-07-15 (×3): 200 mg via ORAL
  Filled 2016-07-12 (×3): qty 2

## 2016-07-12 MED ORDER — IOPAMIDOL (ISOVUE-370) INJECTION 76%
INTRAVENOUS | Status: AC
  Filled 2016-07-12: qty 100

## 2016-07-12 MED ORDER — PRO-STAT SUGAR FREE PO LIQD
30.0000 mL | Freq: Three times a day (TID) | ORAL | Status: DC
  Administered 2016-07-12 – 2016-07-15 (×8): 30 mL via ORAL
  Filled 2016-07-12 (×8): qty 30

## 2016-07-12 MED ORDER — HYDROCODONE-HOMATROPINE 5-1.5 MG/5ML PO SYRP
5.0000 mL | ORAL_SOLUTION | Freq: Three times a day (TID) | ORAL | Status: DC | PRN
  Administered 2016-07-13 – 2016-07-15 (×3): 5 mL via ORAL
  Filled 2016-07-12 (×3): qty 5

## 2016-07-12 MED ORDER — METHYLPREDNISOLONE SODIUM SUCC 125 MG IJ SOLR
60.0000 mg | Freq: Three times a day (TID) | INTRAMUSCULAR | Status: DC
Start: 2016-07-12 — End: 2016-07-15
  Administered 2016-07-12 – 2016-07-15 (×9): 60 mg via INTRAVENOUS
  Filled 2016-07-12 (×9): qty 2

## 2016-07-12 MED ORDER — IPRATROPIUM-ALBUTEROL 0.5-2.5 (3) MG/3ML IN SOLN
3.0000 mL | Freq: Three times a day (TID) | RESPIRATORY_TRACT | Status: DC
  Administered 2016-07-12 – 2016-07-14 (×7): 3 mL via RESPIRATORY_TRACT
  Filled 2016-07-12 (×7): qty 3

## 2016-07-12 MED ORDER — POTASSIUM CHLORIDE CRYS ER 20 MEQ PO TBCR
40.0000 meq | EXTENDED_RELEASE_TABLET | Freq: Four times a day (QID) | ORAL | Status: AC
  Administered 2016-07-12 (×2): 40 meq via ORAL
  Filled 2016-07-12 (×2): qty 2

## 2016-07-12 NOTE — Progress Notes (Signed)
  Echocardiogram 2D Echocardiogram has been performed.  Steven Pope, Steven Pope 07/12/2016, 12:56 PM

## 2016-07-12 NOTE — Progress Notes (Signed)
The Pt will not be able to due a flutter valve today. RT will try tomorrow. The Pt is very weak and sleepy. RT will continue omonitor

## 2016-07-12 NOTE — Progress Notes (Signed)
PROGRESS NOTE                                                                                                                                                                                                             Patient Demographics:    Steven Pope, is a 82 y.o. male, DOB - 03-Nov-1934, RUE:[id number]  Admit date - 07/11/2016   Admitting Physician Ozella Rocks, MD  Outpatient Primary MD for the patient is [name], MD  LOS - 1  Chief Complaint  Patient presents with  . Loss of Consciousness       Brief Narrative   Steven Pope is a 82 y.o. male with medical history significant for diabetes, hypertension, Alzheimer's, COPD on oxygen, Suspicion for underlying pulmonary fibrosis was admitted for acute hypoxic respiratory failure causing metabolic encephalopathy due to pneumonia.   Subjective:    Steven Pope today has, No headache, No chest pain, No abdominal pain - No Nausea, No new weakness tingling or numbness, +ve Cough & SOB.    Assessment  & Plan :    1. Acute on chronic hypoxic respiratory failure due to pneumonia causing metabolic encephalopathy - currently on empiric antibiotics, oxygen along with nebulizer treatments, leukocytosis improved, sepsis physiology seems to have resolved, he appears nontoxic, still has coarse breath sounds, continue nebulizer treatments, flutter valve and supportive care. His baseline appears to be quite poor monitor closely.  2. Suspicion for underlying pulmonary fibrosis, also tachycardic here. Get CT angiogram of the chest to rule out PE and also to see if lung parenchyma can be better defined. We'll add low-dose steroids as he is still quite hypoxic with minimal exertion, note he is on 5 mg of prednisone chronically.  3. Mild elevation in troponin. Chest pain-free, troponin rise is flat and in non-ACS pattern likely due to demand ischemia from #1 above. Tinea aspirin,  statin and beta blocker for secondary prevention, check echocardiogram to evaluate wall motion and EF. He is chest pain-free and EKG is nonacute.  4. Hypertension. Continue Lopressor.  5. Dyslipidemia. On statin.   6. DM type II. On sliding scale.  Lab Results  Component Value Date   HGBA1C 6.8 (H) 07/11/2016   CBG (last 3)   Recent Labs  07/11/16 1812 07/11/16 2102 07/12/16 0827  GLUCAP 175* 172* 169*  Diet : Diet heart healthy/carb modified Room service appropriate? Yes; Fluid consistency: Thin    Family Communication  :  None  Code Status :  Full  Disposition Plan  :  Stay Inpt  Consults  :  None  Procedures  :   CTA   TTE  DVT Prophylaxis  :  Lovenox   Lab Results  Component Value Date   PLT 184 07/12/2016    Inpatient Medications  Scheduled Meds: . aspirin EC  81 mg Oral Daily  . atorvastatin  40 mg Oral Daily  . enoxaparin (LOVENOX) injection  40 mg Subcutaneous Q24H  . feeding supplement (PRO-STAT SUGAR FREE 64)  30 mL Oral TID WC  . gabapentin  100 mg Oral TID  . insulin aspart  0-5 Units Subcutaneous QHS  . insulin aspart  0-9 Units Subcutaneous TID WC  . ipratropium-albuterol  3 mL Nebulization TID  . metoprolol tartrate  25 mg Oral BID  . mirtazapine  15 mg Oral QHS  . mometasone-formoterol  2 puff Inhalation BID  . piperacillin-tazobactam (ZOSYN)  IV  3.375 g Intravenous Q8H  . potassium chloride  40 mEq Oral Q6H  . predniSONE  5 mg Oral Q breakfast  . sodium chloride flush  3 mL Intravenous Q12H  . vancomycin  1,000 mg Intravenous Q24H   Continuous Infusions: . sodium chloride     PRN Meds:.benzonatate, guaiFENesin-dextromethorphan, HYDROcodone-homatropine  Antibiotics  :    Anti-infectives    Start     Dose/Rate Route Frequency Ordered Stop   07/12/16 0800  vancomycin (VANCOCIN) IVPB 1000 mg/200 mL premix     1,000 mg 200 mL/hr over 60 Minutes Intravenous Every 24 hours 07/11/16 0720     07/11/16 1400   piperacillin-tazobactam (ZOSYN) IVPB 3.375 g     3.375 g 12.5 mL/hr over 240 Minutes Intravenous Every 8 hours 07/11/16 0642     07/11/16 0545  piperacillin-tazobactam (ZOSYN) IVPB 3.375 g     3.375 g 100 mL/hr over 30 Minutes Intravenous  Once 07/11/16 0539 07/11/16 0844   07/11/16 0545  vancomycin (VANCOCIN) IVPB 1000 mg/200 mL premix     1,000 mg 200 mL/hr over 60 Minutes Intravenous  Once 07/11/16 0539 07/11/16 0844         Objective:   Vitals:   07/12/16 0458 07/12/16 0808 07/12/16 0900 07/12/16 1100  BP: 135/73  104/78 127/72  Pulse: (!) 113  (!) 114 (!) 114  Resp: 20  (!) 28 21  Temp: 98.2 F (36.8 C)  98.3 F (36.8 C) 97.4 F (36.3 C)  TempSrc: Oral  Axillary Axillary  SpO2: 93% 94% 92% (!) 89%  Weight: 41.4 kg (91 lb 4.8 oz)     Height:        Wt Readings from Last 3 Encounters:  07/12/16 41.4 kg (91 lb 4.8 oz)  03/26/16 49.9 kg (110 lb)  12/20/15 48.1 kg (106 lb)     Intake/Output Summary (Last 24 hours) at 07/12/16 1127 Last data filed at 07/12/16 1000  Gross per 24 hour  Intake          2475.83 ml  Output              575 ml  Net          1900.83 ml     Physical Exam  Awake Alert, Oriented X 3, No new F.N deficits, Normal affect [place].AT,PERRAL Supple Neck,No JVD, No cervical lymphadenopathy appriciated.  Symmetrical Chest wall movement, Good air movement  bilaterally, Coarse B sounds RRR,No Gallops,Rubs or new Murmurs, No Parasternal Heave +ve B.Sounds, Abd Soft, No tenderness, No organomegaly appriciated, No rebound - guarding or rigidity. No Cyanosis, Clubbing or edema, No new Rash or bruise       Data Review:    CBC  Recent Labs Lab 07/11/16 0607 07/12/16 0526  WBC 30.5* 15.4*  HGB 13.5 11.9*  HCT 42.2 38.6*  PLT 180 184  MCV 88.1 88.1  MCH 28.2 27.2  MCHC 32.0 30.8  RDW 13.7 13.5  LYMPHSABS 0.6*  --   MONOABS 1.8*  --   EOSABS 0.0  --   BASOSABS 0.0  --     Chemistries   Recent Labs Lab 07/11/16 0607 07/12/16 0526  NA  138 139  K 4.7 3.1*  CL 92* 96*  CO2 32 33*  GLUCOSE 272* 169*  BUN 28* 16  CREATININE 1.00 0.65  CALCIUM 10.2 8.8*  AST 146*  --   ALT 103*  --   ALKPHOS 87  --   BILITOT 1.0  --    ------------------------------------------------------------------------------------------------------------------ No results for input(s): CHOL, HDL, LDLCALC, TRIG, CHOLHDL, LDLDIRECT in the last 72 hours.  Lab Results  Component Value Date   HGBA1C 6.8 (H) 07/11/2016   ------------------------------------------------------------------------------------------------------------------ No results for input(s): TSH, T4TOTAL, T3FREE, THYROIDAB in the last 72 hours.  Invalid input(s): FREET3 ------------------------------------------------------------------------------------------------------------------ No results for input(s): VITAMINB12, FOLATE, FERRITIN, TIBC, IRON, RETICCTPCT in the last 72 hours.  Coagulation profile  Recent Labs Lab 07/11/16 0753  INR 1.33    No results for input(s): DDIMER in the last 72 hours.  Cardiac Enzymes  Recent Labs Lab 07/11/16 1155 07/11/16 1756 07/12/16 0526  TROPONINI 0.17* 0.15* 0.22*   ------------------------------------------------------------------------------------------------------------------    Component Value Date/Time   BNP 283.8 (H) 07/11/2016 1610    Micro Results No results found for this or any previous visit (from the past 240 hour(s)).  Radiology Reports Dg Chest Port 1 View  Result Date: 07/11/2016 CLINICAL DATA:  82 y/o  M; CTR for 5 minutes with return of pulses. EXAM: PORTABLE CHEST 1 VIEW COMPARISON:  11/29/2015 chest radiograph FINDINGS: Coarse reticular markings in the lungs are similar to the prior chest radiograph and are consistent with pulmonary fibrosis. There are superimposed patchy airspace opacities which may represent pulmonary edema or multifocal pneumonia. Stable cardiac silhouette. No pleural effusion. Chronic  right lateral rib fractures are present. IMPRESSION: Pulmonary fibrosis. Superimposed patchy airspace opacities may represent pulmonary edema or multifocal pneumonia. Electronically Signed   By: Mitzi Hansen M.D.   On: 07/11/2016 06:19    Time Spent in minutes  30   SINGH,PRASHANT K M.D on 07/12/2016 at 11:27 AM  Between 7am to 7pm - Pager - [phone]  After 7pm go to [email or web address] - password [facility]  [facility] -  Office  [phone]

## 2016-07-12 NOTE — Progress Notes (Signed)
Pt continue resting with problem.  02 sats 100 on 4 L.  Respiration 20. Will continue to monitor.  Amanda PeaNellie Lissie Hinesley, Charity fundraiserN.

## 2016-07-12 NOTE — Clinical Social Work Note (Signed)
Patient is a [facility]term resident from [facility]. CSW called and left voicemail for patient's son. Will confirm plan for return once stable for discharge once call is returned.  Charlynn CourtSarah Kyon Bentler, CSW [phone]

## 2016-07-12 NOTE — Progress Notes (Signed)
At 1135 Pt  breathing 44 breaths/min  back to bed with 2 assist. Pt continue w/general weakness.  02 sats 93.  Notified Dr. Thedore MinsSingh and instructed turn 02 from 2L to 6L/highest on flow meter for 15 minutes and turn back do down to 4 L. Marland Kitchen. 02 sats 100% and 99-100% respectively.  At 1139 Respiratory notified for treatment. Pt RR went down to 32.  Dr. Thedore MinsSingh came to evaluate pt now breathing at 20 b/m.  Pt stated he feels comfortable and breathing better.  Will continue to monitor.  Amanda PeaNellie Minahil Quinlivan, Charity fundraiserN.

## 2016-07-12 NOTE — Progress Notes (Signed)
Pt with severe coughing spells. Robitussin ordered first per standing orders. Coughing spells persisted, so MD paged. Orders for Tessalon and Hycodan received. Will continue to monitor.

## 2016-07-13 LAB — GLUCOSE, CAPILLARY
GLUCOSE-CAPILLARY: 201 mg/dL — AB (ref 65–99)
Glucose-Capillary: 192 mg/dL — ABNORMAL HIGH (ref 65–99)
Glucose-Capillary: 286 mg/dL — ABNORMAL HIGH (ref 65–99)
Glucose-Capillary: 193 mg/dL — ABNORMAL HIGH (ref 65–99)

## 2016-07-13 LAB — BASIC METABOLIC PANEL
Anion gap: 10 (ref 5–15)
BUN: 15 mg/dL (ref 6–20)
CALCIUM: 9.1 mg/dL (ref 8.9–10.3)
CO2: 29 mmol/L (ref 22–32)
Chloride: 98 mmol/L — ABNORMAL LOW (ref 101–111)
Creatinine, Ser: 0.65 mg/dL (ref 0.61–1.24)
GFR calc Af Amer: >60 mL/min (ref >60)
GLUCOSE: 216 mg/dL — AB (ref 65–99)
POTASSIUM: 5.3 mmol/L — AB (ref 3.5–5.1)
Sodium: 137 mmol/L (ref 135–145)

## 2016-07-13 LAB — CBC
HCT: 40.6 % (ref 39.0–52.0)
Hemoglobin: 12.3 g/dL — ABNORMAL LOW (ref 13.0–17.0)
MCH: 27 pg (ref 26.0–34.0)
MCHC: 30.3 g/dL (ref 30.0–36.0)
MCV: 89 fL (ref 78.0–100.0)
PLATELETS: 205 10*3/uL (ref 150–400)
RBC: 4.56 MIL/uL (ref 4.22–5.81)
RDW: 13.8 % (ref 11.5–15.5)
WBC: 8 10*3/uL (ref 4.0–10.5)

## 2016-07-13 MED ORDER — LEVOFLOXACIN 750 MG PO TABS
750.0000 mg | ORAL_TABLET | ORAL | Status: DC
  Administered 2016-07-14: 750 mg via ORAL
  Filled 2016-07-13 (×2): qty 1

## 2016-07-13 MED ORDER — SODIUM POLYSTYRENE SULFONATE 15 GM/60ML PO SUSP
30.0000 g | Freq: Once | ORAL | Status: AC
  Administered 2016-07-13: 30 g via ORAL
  Filled 2016-07-13: qty 120

## 2016-07-13 NOTE — Progress Notes (Signed)
Pharmacy Antibiotic Note  Steven SchwabJuan Martin Pope is a 82 y.o. male admitted on 07/11/2016 with sepsis.   Day # 3 of vancomycin and Zosyn  Cultures negative, WBC WNL, afebrile   Plan: Zosyn 3.375gm IV q8h - doses over 4 hours Vancomycin 1gm IV q24h  Consider de-escalating if appropriate?  Height: 5' (152.4 cm) Weight: 93 lb 7.6 oz (42.4 kg) IBW/kg (Calculated) : 50  Temp (24hrs), Avg:98.3 F (36.8 C), Min:97.4 F (36.3 C), Max:99.4 F (37.4 C)   Recent Labs Lab 07/11/16 0607 07/11/16 0623 07/11/16 0753 07/11/16 1155 07/12/16 0526 07/13/16 0320  WBC 30.5*  --   --   --  15.4* 8.0  CREATININE 1.00  --   --   --  0.65 0.65  LATICACIDVEN  --  3.93* 1.5 1.2  --   --     Estimated Creatinine Clearance: 44.9 mL/min (by C-G formula based on SCr of 0.65 mg/dL).    No Known Allergies  Antimicrobials this admission: 2/7 Vanc >>  2/7 Zosyn >>   Dose adjustments this admission: n/a  Microbiology results: 2/7 BCx x2: NG  Thank you for allowing pharmacy to be a part of this patient's care. Okey RegalLisa Halim Surrette, PharmD [phone] 07/13/2016 10:24 AM

## 2016-07-13 NOTE — Clinical Social Work Note (Signed)
CSW called and left patient's son a Engineer, technical salesvoicemail.  Charlynn CourtSarah Sallie Staron, CSW [phone]

## 2016-07-13 NOTE — Progress Notes (Signed)
Md resumed diet to heart healthy carb modified diet.

## 2016-07-13 NOTE — Progress Notes (Signed)
PROGRESS NOTE                                                                                                                                                                                                             Patient Demographics:    Steven Pope, is a 82 y.o. male, DOB - 12-18-1934, ZOX:[id number]  Admit date - 07/11/2016   Admitting Physician Ozella Rocks, MD  Outpatient Primary MD for the patient is [name], MD  LOS - 2  Chief Complaint  Patient presents with  . Loss of Consciousness       Brief Narrative   Steven Pope is a 82 y.o. male with medical history significant for diabetes, hypertension, Alzheimer's, COPD on oxygen, Suspicion for underlying pulmonary fibrosis was admitted for acute hypoxic respiratory failure causing metabolic encephalopathy due to pneumonia.   Subjective:    Steven Pope today has, No headache, No chest pain, No abdominal pain - No Nausea, No new weakness tingling or numbness, +ve Cough & SOB.    Assessment  & Plan :    1. Acute on chronic hypoxic respiratory failure due to pneumonia causing metabolic encephalopathy - currently on empiric antibiotics, oxygen along with nebulizer treatments, leukocytosis improved, sepsis physiology seems to have resolved, he appears nontoxic, improving breath sounds, continue nebulizer treatments, flutter valve and supportive care. His baseline appears to be quite poor and currently he says he is getting close to his baseline.  2. Suspicion for underlying pulmonary fibrosis - CT chest rules out PE, confirms patchy pulmonary fibrosis, note he is on 5 mg of prednisone chronically. Continue outpatient pulmonary follow-up with his primary pulmonologist Dr. Burman Blacksmith.  3. Mild elevation in troponin. Chest pain-free, troponin rise is flat and in non-ACS pattern likely due to demand ischemia from #1 above. Continue aspirin, statin and beta blocker for  secondary prevention, stable echocardiogram preserved EF of 50% and no reported wall motion abnormality. He is chest pain-free and EKG is nonacute.  4. Hypertension. Continue Lopressor.  5. Dyslipidemia. On statin.   6. Incidental finding of pneumomediastinum on CT scan of the chest. Discussed with radiologist, there is no fluid collection or inflammation suggesting this is air leak from coughing and pulmonary source. Recommended to follow clinically only. Patient's symptom free.  7. DM type II. On sliding scale.  Lab Results  Component Value Date   HGBA1C 6.8 (H) 07/11/2016   CBG (last 3)   Recent Labs  07/12/16 1640 07/12/16 2110 07/13/16 0733  GLUCAP 127* 208* 192*      Diet : Diet heart healthy/carb modified Room service appropriate? Yes; Fluid consistency: Thin    Family Communication  :  None  Code Status :  Full  Disposition Plan  :  DC in 1-2 days.  Consults  :  None  Procedures  :   CTA - no PE, patchy for a fibrosis, pneumomediastinum without any fluid or inflammation.  TTE - Study Conclusions  - Left ventricle: The estimated ejection fraction was = 50%. - Right ventricle: The cavity size was dilated. - Right atrium: The atrium was dilated.  Recommendations:  Consider transesophageal echocardiography if clinically indicated in order to better evaluate.    DVT Prophylaxis  :  Lovenox   Lab Results  Component Value Date   PLT 205 07/13/2016    Inpatient Medications  Scheduled Meds: . aspirin EC  81 mg Oral Daily  . atorvastatin  40 mg Oral Daily  . enoxaparin (LOVENOX) injection  40 mg Subcutaneous Q24H  . feeding supplement (PRO-STAT SUGAR FREE 64)  30 mL Oral TID WC  . gabapentin  100 mg Oral TID  . insulin aspart  0-5 Units Subcutaneous QHS  . insulin aspart  0-9 Units Subcutaneous TID WC  . ipratropium-albuterol  3 mL Nebulization TID  . methylPREDNISolone (SOLU-MEDROL) injection  60 mg Intravenous TID  . metoprolol tartrate  25 mg  Oral BID  . mirtazapine  15 mg Oral QHS  . mometasone-formoterol  2 puff Inhalation BID  . piperacillin-tazobactam (ZOSYN)  IV  3.375 g Intravenous Q8H  . sodium chloride flush  3 mL Intravenous Q12H  . vancomycin  1,000 mg Intravenous Q24H   Continuous Infusions:  PRN Meds:.benzonatate, guaiFENesin-dextromethorphan, HYDROcodone-homatropine  Antibiotics  :    Anti-infectives    Start     Dose/Rate Route Frequency Ordered Stop   07/12/16 0800  vancomycin (VANCOCIN) IVPB 1000 mg/200 mL premix     1,000 mg 200 mL/hr over 60 Minutes Intravenous Every 24 hours 07/11/16 0720     07/11/16 1400  piperacillin-tazobactam (ZOSYN) IVPB 3.375 g     3.375 g 12.5 mL/hr over 240 Minutes Intravenous Every 8 hours 07/11/16 0642     07/11/16 0545  piperacillin-tazobactam (ZOSYN) IVPB 3.375 g     3.375 g 100 mL/hr over 30 Minutes Intravenous  Once 07/11/16 0539 07/11/16 0844   07/11/16 0545  vancomycin (VANCOCIN) IVPB 1000 mg/200 mL premix     1,000 mg 200 mL/hr over 60 Minutes Intravenous  Once 07/11/16 0539 07/11/16 0844         Objective:   Vitals:   07/12/16 0458 07/12/16 0808 07/12/16 0900 07/12/16 1100  BP: 135/73  104/78 127/72  Pulse: (!) 113  (!) 114 (!) 114  Resp: 20  (!) 28 21  Temp: 98.2 F (36.8 C)  98.3 F (36.8 C) 97.4 F (36.3 C)  TempSrc: Oral  Axillary Axillary  SpO2: 93% 94% 92% (!) 89%  Weight: 41.4 kg (91 lb 4.8 oz)     Height:        Wt Readings from Last 3 Encounters:  07/13/16 42.4 kg (93 lb 7.6 oz)  03/26/16 49.9 kg (110 lb)  12/20/15 48.1 kg (106 lb)     Intake/Output Summary (Last 24 hours) at 07/13/16 1018 Last data filed at 07/13/16 1008  Gross  per 24 hour  Intake              323 ml  Output              450 ml  Net             -127 ml     Physical Exam  Awake Alert, Oriented X 3, No new F.N deficits, Normal affect [place].AT,PERRAL Supple Neck,No JVD, No cervical lymphadenopathy appriciated.  Symmetrical Chest wall movement, Good air movement  bilaterally, Coarse B sounds RRR,No Gallops,Rubs or new Murmurs, No Parasternal Heave +ve B.Sounds, Abd Soft, No tenderness, No organomegaly appriciated, No rebound - guarding or rigidity. No Cyanosis, Clubbing or edema, No new Rash or bruise       Data Review:    CBC  Recent Labs Lab 07/11/16 0607 07/12/16 0526 07/13/16 0320  WBC 30.5* 15.4* 8.0  HGB 13.5 11.9* 12.3*  HCT 42.2 38.6* 40.6  PLT 180 184 205  MCV 88.1 88.1 89.0  MCH 28.2 27.2 27.0  MCHC 32.0 30.8 30.3  RDW 13.7 13.5 13.8  LYMPHSABS 0.6*  --   --   MONOABS 1.8*  --   --   EOSABS 0.0  --   --   BASOSABS 0.0  --   --     Chemistries   Recent Labs Lab 07/11/16 0607 07/12/16 0526 07/13/16 0320  NA 138 139 137  K 4.7 3.1* 5.3*  CL 92* 96* 98*  CO2 32 33* 29  GLUCOSE 272* 169* 216*  BUN 28* 16 15  CREATININE 1.00 0.65 0.65  CALCIUM 10.2 8.8* 9.1  AST 146*  --   --   ALT 103*  --   --   ALKPHOS 87  --   --   BILITOT 1.0  --   --    ------------------------------------------------------------------------------------------------------------------ No results for input(s): CHOL, HDL, LDLCALC, TRIG, CHOLHDL, LDLDIRECT in the last 72 hours.  Lab Results  Component Value Date   HGBA1C 6.8 (H) 07/11/2016   ------------------------------------------------------------------------------------------------------------------ No results for input(s): TSH, T4TOTAL, T3FREE, THYROIDAB in the last 72 hours.  Invalid input(s): FREET3 ------------------------------------------------------------------------------------------------------------------ No results for input(s): VITAMINB12, FOLATE, FERRITIN, TIBC, IRON, RETICCTPCT in the last 72 hours.  Coagulation profile  Recent Labs Lab 07/11/16 0753  INR 1.33    No results for input(s): DDIMER in the last 72 hours.  Cardiac Enzymes  Recent Labs Lab 07/11/16 1155 07/11/16 1756 07/12/16 0526  TROPONINI 0.17* 0.15* 0.22*    ------------------------------------------------------------------------------------------------------------------    Component Value Date/Time   BNP 283.8 (H) 07/11/2016 0607    Micro Results Recent Results (from the past 240 hour(s))  Blood Culture (routine x 2)     Status: None (Preliminary result)   Collection Time: 07/11/16  6:00 AM  Result Value Ref Range Status   Specimen Description BLOOD RIGHT ARM  Final   Special Requests BOTTLES DRAWN AEROBIC AND ANAEROBIC 5CC  Final   Culture NO GROWTH 1 DAY  Final   Report Status PENDING  Incomplete  Blood Culture (routine x 2)     Status: None (Preliminary result)   Collection Time: 07/11/16  6:10 AM  Result Value Ref Range Status   Specimen Description BLOOD RIGHT HAND  Final   Special Requests IN PEDIATRIC BOTTLE 3CC  Final   Culture NO GROWTH 1 DAY  Final   Report Status PENDING  Incomplete    Radiology Reports Ct Angio Chest Pe W Or Wo Contrast  Result Date: 07/12/2016 CLINICAL DATA:  Shortness of breath hypoxic respiratory failure EXAM: CT ANGIOGRAPHY CHEST WITH CONTRAST TECHNIQUE: Multidetector CT imaging of the chest was performed using the standard protocol during bolus administration of intravenous contrast. Multiplanar CT image reconstructions and MIPs were obtained to evaluate the vascular anatomy. CONTRAST:  80 mL Isovue 370 intravenous COMPARISON:  Chest x-ray 07/11/2016, 11/27/2015 FINDINGS: Cardiovascular: Satisfactory opacification of the pulmonary arteries to the segmental level. No evidence of pulmonary embolism. Pulmonary arterial trunk is enlarged, measuring up to 3.6 cm in diameter. There are coronary artery calcifications. There is borderline cardiomegaly. No pericardial effusion. Atherosclerosis of the aorta. No aneurysm. Mediastinum/Nodes: Thyroid within normal limits. Trachea is midline. Esophagus is within normal limits. Moderate amount of pneumomediastinum with mediastinal air present around the aortic arch,  great vessels, trachea, and around the heart. Dissection of small amount of air into the upper abdomen. Mild to moderate subcutaneous emphysema along the anterior chest wall with extension of soft tissue gas into the right lower neck. Scattered mediastinal lymph nodes.  No grossly enlarged hilar nodes. Lungs/Pleura: Mild to moderate emphysematous disease within the bilateral upper lobes. Bilateral areas of subpleural fibrosis, greatest within the bilateral lung bases. Multifocal ground-glass densities within the bilateral lower lobes, right middle lobe and upper lobe suspicious for pneumonia or pulmonary inflammatory process. No gross pneumothorax. Small left pleural effusion. Upper Abdomen: Colonic inter positioning in the right upper quadrant. Trace amount of gas dissecting into the upper abdominal cavity. Musculoskeletal: Degenerative changes. No acute or suspicious bone lesions. Review of the MIP images confirms the above findings. IMPRESSION: 1. No CT evidence for pulmonary embolus or aortic dissection 2. Mild to moderate emphysematous disease within the bilateral upper lobes. Bilateral areas of subpleural fibrosis. Multifocal ground-glass densities throughout all lobes of the lung, compatible with multifocal pneumonia or pulmonary inflammatory process. Small left pleural effusion. 3. Moderate amount of pneumomediastinum with largest concentration of mediastinal air present around the heart. Small amount of subcutaneous emphysema within the right and right lower chest wall with dissection of small amount of air into the upper abdomen. Electronically Signed   By: Jasmine PangKim  Fujinaga M.D.   On: 07/12/2016 19:12   Dg Chest Port 1 View  Result Date: 07/11/2016 CLINICAL DATA:  82 y/o  M; CTR for 5 minutes with return of pulses. EXAM: PORTABLE CHEST 1 VIEW COMPARISON:  11/29/2015 chest radiograph FINDINGS: Coarse reticular markings in the lungs are similar to the prior chest radiograph and are consistent with pulmonary  fibrosis. There are superimposed patchy airspace opacities which may represent pulmonary edema or multifocal pneumonia. Stable cardiac silhouette. No pleural effusion. Chronic right lateral rib fractures are present. IMPRESSION: Pulmonary fibrosis. Superimposed patchy airspace opacities may represent pulmonary edema or multifocal pneumonia. Electronically Signed   By: Mitzi HansenLance  Furusawa-Stratton M.D.   On: 07/11/2016 06:19    Time Spent in minutes  30   Tessla Spurling K M.D on 07/13/2016 at 10:18 AM  Between 7am to 7pm - Pager - [phone]  After 7pm go to [email or web address] - password [email or web address]  [facility] -  Office  [phone]

## 2016-07-13 NOTE — Progress Notes (Signed)
Initial Nutrition Assessment  DOCUMENTATION CODES:   Severe malnutrition in context of chronic illness, Underweight  INTERVENTION:  Patient is currently NPO.   When patient is no longer NPO, recommend Ensure Enlive TID between meals and Magic Cup BID with meals. Each Ensure supplement provides 350 calories and 20 grams of protein. Each B[employer] GroupMagic Cup provides 290 calories and 9 grams of protein.  NUTRITION DIAGNOSIS:  Malnutrition related to chronic illness as evidenced by severe depletion of muscle mass, severe depletion of body fat, 15.45 percent weight loss over the past 3 months.  GOAL:   Patient will meet greater than or equal to 90% of their needs  MONITOR:   I & O's, Diet advancement, Labs, Weight trends  REASON FOR ASSESSMENT:    (Low BMI)    ASSESSMENT:  [age] yo male admitted on 2/7 with sepsis- become unresponsive at SNF and received CPR. PMHx significant of DM, HTN, pulmonary fibrosis, COPD on oxygen, Alzheimer's dementia, recent pneumonia.  Patient was alert and communicated by nodding yes or no to questions and speaking a few words at the time. Per patient report, his UBW is 106. Patient reported this weight loss has occurred over the past month. Per patient chart, a 17 lb weight loss has occurred over the past 3 months. This is a 15.45% weight loss, which is significant for this time frame. Patient reported eating 3 meals per day PTA and that his appetite was "good."  Per nurse tech, patient reported feeling hungry, but when she tried to feed him, he became easily distracted and had a hard time focusing on his meal. Per patient chart, he consumed 10% of breakfast.  Nutrition Focused Physical Exam found severe muscle mass and fat depletion.  Labs Reviewed: Potassium 5.3, Chloride 98, Glucose 216  Meds Reviewed: Lipitor, Pro-Stat, Novolog, Solu-Medrol, Lopressor,   Diet Order:  Diet NPO time specified  Skin:  Wound (see comment) (Stage II Pressure Inury sacrum)  Last  BM:  2/5  Height:   Ht Readings from Last 1 Encounters:  07/11/16 5' (1.524 m)    Weight:   Wt Readings from Last 1 Encounters:  07/13/16 93 lb 7.6 oz (42.4 kg)    Ideal Body Weight:  48 kg  BMI:  Body mass index is 18.26 kg/m.  Estimated Nutritional Needs:   Kcal:  1400-1600 (34-38 kcal/kg)  Protein:  70-80 grams  Fluid:  1.4-1.6 L/day  EDUCATION NEEDS:   No education needs identified at this time  Rex KrasGrace Ann Llana Deshazo M.S. Nutrition Dietetic Intern

## 2016-07-13 NOTE — NC FL2 (Signed)
[place] MEDICAID FL2 LEVEL OF CARE SCREENING TOOL     IDENTIFICATION  Patient Name: Steven Pope Birthdate: 06-02-1935 Sex: male Admission Date (Current Location): 07/11/2016  [facility] [place]   Facility and Address:  [facility] [place], [address], [place], [place] [address]      Provider Number: [id number]  Attending Physician Name and Address:  Leroy SeaPrashant K Singh, MD  Relative Name and Phone Number:       Current Level of Care: Hospital Recommended Level of Care: Skilled Nursing Facility Prior Approval Number:    Date Approved/Denied:   PASRR Number: [id number]  Discharge Plan: SNF    Current Diagnoses: Patient Active Problem List   Diagnosis Date Noted  . Respiratory distress 07/11/2016  . Acute encephalopathy 07/11/2016  . Leukocytosis 07/11/2016  . Chronic respiratory failure (HCC)   . Diabetes mellitus with complication (HCC)   . Hypoxemia 03/26/2016  . Altered mental status   . Sepsis (HCC) 11/27/2015  . Alzheimer disease 11/27/2015  . COPD (chronic obstructive pulmonary disease) (HCC) 11/27/2015  . Pulmonary fibrosis (HCC) 11/27/2015  . Essential hypertension 11/27/2015  . Type 2 diabetes mellitus without complication, without long-term current use of insulin (HCC) 11/27/2015  . Elevated troponin 11/27/2015  . AKI (acute kidney injury) (HCC) 11/27/2015  . HCAP (healthcare-associated pneumonia) 11/27/2015  . Severe protein-calorie malnutrition (HCC) 11/27/2015  . Pressure ulcer 11/27/2015    Orientation RESPIRATION BLADDER Height & Weight     Self  O2 (Nasal Canula 2 L) Continent Weight: 93 lb 7.6 oz (42.4 kg) Height:  5' (152.4 cm)  BEHAVIORAL SYMPTOMS/MOOD NEUROLOGICAL BOWEL NUTRITION STATUS   (None)  (Alzheimer's) Continent Diet (See discharge summary)  AMBULATORY STATUS COMMUNICATION OF NEEDS Skin     Verbally PU Stage and Appropriate Care PU Stage 1 Dressing:  (Sacrum) PU Stage 2 Dressing:   (Medial sacrum: Foam prn)                   Personal Care Assistance Level of Assistance              Functional Limitations Info  Sight, Hearing, Speech Sight Info: Adequate Hearing Info: Adequate Speech Info: Adequate    SPECIAL CARE FACTORS FREQUENCY  Blood pressure                    Contractures Contractures Info: Not present    Additional Factors Info  Code Status, Allergies Code Status Info: Full Allergies Info: NKDA           Current Medications (07/13/2016):  This is the current hospital active medication list Current Facility-Administered Medications  Medication Dose Route Frequency Provider Last Rate Last Dose  . aspirin EC tablet 81 mg  81 mg Oral Daily Lesle ChrisKaren M Black, NP   81 mg at 07/13/16 1011  . atorvastatin (LIPITOR) tablet 40 mg  40 mg Oral Daily Lesle ChrisKaren M Black, NP   40 mg at 07/13/16 1011  . benzonatate (TESSALON) capsule 200 mg  200 mg Oral TID PRN Leda GauzeKaren J Kirby-Graham, NP   200 mg at 07/12/16 [id number]  . enoxaparin (LOVENOX) injection 40 mg  40 mg Subcutaneous Q24H Gwenyth BenderKaren M Black, NP   40 mg at 07/13/16 1012  . feeding supplement (PRO-STAT SUGAR FREE 64) liquid 30 mL  30 mL Oral TID WC Leroy SeaPrashant K Singh, MD   30 mL at 07/13/16 1012  . gabapentin (NEURONTIN) capsule 100 mg  100 mg Oral TID  Ozella Rocks, MD   100 mg at 07/13/16 1011  . guaiFENesin-dextromethorphan (ROBITUSSIN DM) 100-10 MG/5ML syrup 5 mL  5 mL Oral Q4H PRN Gwenyth Bender, NP   5 mL at 07/12/16 0432  . HYDROcodone-homatropine (HYCODAN) 5-1.5 MG/5ML syrup 5 mL  5 mL Oral Q8H PRN Leda Gauze, NP      . insulin aspart (novoLOG) injection 0-5 Units  0-5 Units Subcutaneous QHS Gwenyth Bender, NP   2 Units at 07/12/16 2236  . insulin aspart (novoLOG) injection 0-9 Units  0-9 Units Subcutaneous TID WC Gwenyth Bender, NP   5 Units at 07/13/16 1232  . ipratropium-albuterol (DUONEB) 0.5-2.5 (3) MG/3ML nebulizer solution 3 mL  3 mL Nebulization TID Karn Pickler, MD   3 mL at 07/13/16 0756   . [START ON 07/14/2016] levofloxacin (LEVAQUIN) tablet 750 mg  750 mg Oral Q48H Leroy Sea, MD      . methylPREDNISolone sodium succinate (SOLU-MEDROL) 125 mg/2 mL injection 60 mg  60 mg Intravenous TID Leroy Sea, MD   60 mg at 07/13/16 1012  . metoprolol tartrate (LOPRESSOR) tablet 25 mg  25 mg Oral BID Leroy Sea, MD   25 mg at 07/13/16 1011  . mirtazapine (REMERON) tablet 15 mg  15 mg Oral QHS Gwenyth Bender, NP   15 mg at 07/12/16 2200  . mometasone-formoterol (DULERA) 200-5 MCG/ACT inhaler 2 puff  2 puff Inhalation BID Ozella Rocks, MD   2 puff at 07/13/16 0756  . piperacillin-tazobactam (ZOSYN) IVPB 3.375 g  3.375 g Intravenous Q8H Leroy Sea, MD 12.5 mL/hr at 07/13/16 0518 3.375 g at 07/13/16 0518  . sodium chloride flush (NS) 0.9 % injection 3 mL  3 mL Intravenous Q12H Gwenyth Bender, NP   3 mL at 07/12/16 2238     Discharge Medications: Please see discharge summary for a list of discharge medications.  Relevant Imaging Results:  Relevant Lab Results:   Additional Information SS#: [id number]  Margarito Liner, LCSW

## 2016-07-13 NOTE — Progress Notes (Signed)
I have made multiple attempts to Page MD about about clarification of NPO status. Holding Lunch until clarified.

## 2016-07-13 NOTE — Progress Notes (Signed)
Pt is currently NPO

## 2016-07-13 NOTE — Progress Notes (Signed)
Pt is alert and following simple commands, very weak taking a breathing treatment, potassium is 5.3 talk to MD and made aware.

## 2016-07-13 NOTE — Progress Notes (Addendum)
Inpatient Diabetes Program Recommendations  AACE/ADA: New Consensus Statement on Inpatient Glycemic Control (2015)  Target Ranges:  Prepandial:   less than 140 mg/dL      Peak postprandial:   less than 180 mg/dL (1-2 hours)      Critically ill patients:  140 - 180 mg/dL   Lab Results  Component Value Date   GLUCAP 192 (H) 07/13/2016   HGBA1C 6.8 (H) 07/11/2016   Results for Steven Pope, [name] (MRN [id number]) as of 07/13/2016 10:35  Ref. Range 07/12/2016 08:27 07/12/2016 11:53 07/12/2016 16:40 07/12/2016 21:10 07/13/2016 07:33  Glucose-Capillary Latest Ref Range: 65 - 99 mg/dL 086169 (H) 578223 (H) 469127 (H) 208 (H) 192 (H)   Review of Glycemic Control  Diabetes history:     DM2 with A1C<7%, elderly, steroids Outpatient Diabetes medications:     Novolog 0-8 units TIDAC & QHS,     Metformin 1000 mg BID  Current orders for Inpatient glycemic control:     NPO    Sensitive correction scale Novolog 0-9 units TIDAC and 0-5 units QHS  Inpatient Diabetes Program Recommendations:    Please consider change in frequency of Sensitive Correction scale Novolog 0-9 units Q4H while NPO.  Thank you,  Kristine LineaKaren Nyala Kirchner, RN, MSN Diabetes Coordinator Inpatient Diabetes Program [phone] (Team Pager)

## 2016-07-14 LAB — BLOOD CULTURE ID PANEL (REFLEXED)
Acinetobacter baumannii: NOT DETECTED
CANDIDA KRUSEI: NOT DETECTED
CANDIDA PARAPSILOSIS: NOT DETECTED
CARBAPENEM RESISTANCE: NOT DETECTED
Candida albicans: NOT DETECTED
Candida glabrata: NOT DETECTED
Candida tropicalis: NOT DETECTED
Enterobacter cloacae complex: NOT DETECTED
Enterobacteriaceae species: DETECTED — AB
Enterococcus species: NOT DETECTED
Escherichia coli: NOT DETECTED
Haemophilus influenzae: NOT DETECTED
KLEBSIELLA OXYTOCA: NOT DETECTED
KLEBSIELLA PNEUMONIAE: DETECTED — AB
Listeria monocytogenes: NOT DETECTED
Neisseria meningitidis: NOT DETECTED
PSEUDOMONAS AERUGINOSA: NOT DETECTED
Proteus species: NOT DETECTED
SERRATIA MARCESCENS: NOT DETECTED
STAPHYLOCOCCUS AUREUS BCID: NOT DETECTED
STAPHYLOCOCCUS SPECIES: NOT DETECTED
STREPTOCOCCUS PYOGENES: NOT DETECTED
Streptococcus agalactiae: NOT DETECTED
Streptococcus pneumoniae: NOT DETECTED
Streptococcus species: NOT DETECTED

## 2016-07-14 LAB — GLUCOSE, CAPILLARY
GLUCOSE-CAPILLARY: 255 mg/dL — AB (ref 65–99)
GLUCOSE-CAPILLARY: 270 mg/dL — AB (ref 65–99)
Glucose-Capillary: 205 mg/dL — ABNORMAL HIGH (ref 65–99)
Glucose-Capillary: 179 mg/dL — ABNORMAL HIGH (ref 65–99)

## 2016-07-14 LAB — CBC
HCT: 35.7 % — ABNORMAL LOW (ref 39.0–52.0)
HEMOGLOBIN: 11.5 g/dL — AB (ref 13.0–17.0)
MCH: 29 pg (ref 26.0–34.0)
MCHC: 32.2 g/dL (ref 30.0–36.0)
MCV: 89.9 fL (ref 78.0–100.0)
PLATELETS: 204 10*3/uL (ref 150–400)
RBC: 3.97 MIL/uL — AB (ref 4.22–5.81)
RDW: 13.6 % (ref 11.5–15.5)
WBC: 11.4 10*3/uL — AB (ref 4.0–10.5)

## 2016-07-14 LAB — BASIC METABOLIC PANEL
Anion gap: 7 (ref 5–15)
BUN: 19 mg/dL (ref 6–20)
CO2: 34 mmol/L — ABNORMAL HIGH (ref 22–32)
CREATININE: 0.65 mg/dL (ref 0.61–1.24)
Calcium: 8.8 mg/dL — ABNORMAL LOW (ref 8.9–10.3)
Chloride: 96 mmol/L — ABNORMAL LOW (ref 101–111)
Glucose, Bld: 246 mg/dL — ABNORMAL HIGH (ref 65–99)
Potassium: 4.7 mmol/L (ref 3.5–5.1)
SODIUM: 137 mmol/L (ref 135–145)

## 2016-07-14 MED ORDER — CEFUROXIME AXETIL 500 MG PO TABS
500.0000 mg | ORAL_TABLET | Freq: Two times a day (BID) | ORAL | Status: DC
  Administered 2016-07-15: 500 mg via ORAL
  Filled 2016-07-14: qty 1

## 2016-07-14 MED ORDER — IPRATROPIUM-ALBUTEROL 0.5-2.5 (3) MG/3ML IN SOLN
3.0000 mL | Freq: Four times a day (QID) | RESPIRATORY_TRACT | Status: DC | PRN
  Administered 2016-07-15 (×2): 3 mL via RESPIRATORY_TRACT
  Filled 2016-07-14 (×2): qty 3

## 2016-07-14 NOTE — Progress Notes (Addendum)
  PHARMACY - PHYSICIAN COMMUNICATION CRITICAL VALUE ALERT - BLOOD CULTURE IDENTIFICATION (BCID)  Results for orders placed or performed during the hospital encounter of 07/11/16  Blood Culture ID Panel (Reflexed) (Collected: 07/11/2016  6:00 AM)  Result Value Ref Range   Enterococcus species NOT DETECTED NOT DETECTED   Listeria monocytogenes NOT DETECTED NOT DETECTED   Staphylococcus species NOT DETECTED NOT DETECTED   Staphylococcus aureus NOT DETECTED NOT DETECTED   Streptococcus species NOT DETECTED NOT DETECTED   Streptococcus agalactiae NOT DETECTED NOT DETECTED   Streptococcus pneumoniae NOT DETECTED NOT DETECTED   Streptococcus pyogenes NOT DETECTED NOT DETECTED   Acinetobacter baumannii NOT DETECTED NOT DETECTED   Enterobacteriaceae species DETECTED (A) NOT DETECTED   Enterobacter cloacae complex NOT DETECTED NOT DETECTED   Escherichia coli NOT DETECTED NOT DETECTED   Klebsiella oxytoca NOT DETECTED NOT DETECTED   Klebsiella pneumoniae DETECTED (A) NOT DETECTED   Proteus species NOT DETECTED NOT DETECTED   Serratia marcescens NOT DETECTED NOT DETECTED   Carbapenem resistance NOT DETECTED NOT DETECTED   Haemophilus influenzae NOT DETECTED NOT DETECTED   Neisseria meningitidis NOT DETECTED NOT DETECTED   Pseudomonas aeruginosa NOT DETECTED NOT DETECTED   Candida albicans NOT DETECTED NOT DETECTED   Candida glabrata NOT DETECTED NOT DETECTED   Candida krusei NOT DETECTED NOT DETECTED   Candida parapsilosis NOT DETECTED NOT DETECTED   Candida tropicalis NOT DETECTED NOT DETECTED   Pt previously on vanc/zosyn for sepsis, transitioned to Levaquin PO on 2/10. BCx 1/2 + for GNR, BCID + for kleb pneumo  Name of physician (or Provider) Contacted: Thedore MinsSingh, P  Changes to prescribed antibiotics required: Change antibiotics to ceftin x 8 days   Babs BertinHaley Ugochi Henzler, PharmD, BCPS Clinical Pharmacist 07/14/2016 5:51 PM

## 2016-07-14 NOTE — Progress Notes (Signed)
Patient rested well overnight. Patient reported no issues with breathing. Patients complaints of pain addressed. Patient in bed resting.

## 2016-07-14 NOTE — Progress Notes (Signed)
PROGRESS NOTE                                                                                                                                                                                                             Patient Demographics:    Steven Pope, is a 82 y.o. male, DOB - [birth year], ZOX:[id number]  Admit date - 07/11/2016   Admitting Physician Ozella Rocks, MD  Outpatient Primary MD for the patient is [name], MD  LOS - 3  Chief Complaint  Patient presents with  . Loss of Consciousness       Brief Narrative   Steven Pope is a 82 y.o. male with medical history significant for diabetes, hypertension, Alzheimer's, COPD on oxygen, Suspicion for underlying pulmonary fibrosis was admitted for acute hypoxic respiratory failure causing metabolic encephalopathy due to pneumonia.   Subjective:    Steven Pope today has, No headache, No chest pain, No abdominal pain - No Nausea, No new weakness tingling or numbness, +ve Cough & SOB. Poor historian due to underlying dementia and now mild delirium.   Assessment  & Plan :    1. Acute on chronic hypoxic respiratory failure due to pneumonia causing metabolic encephalopathy - currently on empiric antibiotics, oxygen along with nebulizer treatments, leukocytosis improved, sepsis physiology seems to have resolved, he appears nontoxic, improving breath sounds, continue nebulizer treatments, flutter valve and supportive care. His baseline appears to be quite poor and currently he says he is getting close to his baseline.  2. Suspicion for underlying pulmonary fibrosis - CT chest rules out PE, confirms patchy pulmonary fibrosis, note he is on 5 mg of prednisone chronically. Continue outpatient pulmonary follow-up with his primary pulmonologist Dr. Burman Blacksmith.  3. Mild elevation in troponin. Chest pain-free, troponin rise is flat and in non-ACS pattern likely due to demand  ischemia from #1 above. Continue aspirin, statin and beta blocker for secondary prevention, stable echocardiogram preserved EF of 50% and no reported wall motion abnormality. He is chest pain-free and EKG is nonacute.  4. Hypertension. Continue Lopressor.  5. Dyslipidemia. On statin.   6. Incidental finding of pneumomediastinum on CT scan of the chest. Discussed with radiologist, there is no fluid collection or inflammation suggesting this is air leak from coughing and pulmonary source. Recommended to follow clinically only. Patient is symptom free.  7. I cleared underlying dementia and mild delirium due to acute sickness. Supportive care. Minimize benzodiazepines and narcotics.   8. DM type II. On sliding scale.  Lab Results  Component Value Date   HGBA1C 6.8 (H) 07/11/2016   CBG (last 3)   Recent Labs  07/13/16 1708 07/13/16 2055 07/14/16 0738  GLUCAP 193* 201* 255*      Diet : Diet heart healthy/carb modified Room service appropriate? Yes; Fluid consistency: Thin    Family Communication  : Myrtie Neither and left message for some [place] on listed phone number on 07/14/2016  Code Status :  Full  Disposition Plan  :  To SNF coming Monday.  Consults  :  None  Procedures  :   CTA - no PE, patchy for a fibrosis, pneumomediastinum without any fluid or inflammation.  TTE - Study Conclusions  - Left ventricle: The estimated ejection fraction was = 50%. - Right ventricle: The cavity size was dilated. - Right atrium: The atrium was dilated.  Recommendations:  Consider transesophageal echocardiography if clinically indicated in order to better evaluate.    DVT Prophylaxis  :  Lovenox   Lab Results  Component Value Date   PLT 204 07/14/2016    Inpatient Medications  Scheduled Meds: . aspirin EC  81 mg Oral Daily  . atorvastatin  40 mg Oral Daily  . enoxaparin (LOVENOX) injection  40 mg Subcutaneous Q24H  . feeding supplement (PRO-STAT SUGAR FREE 64)  30 mL Oral TID  WC  . gabapentin  100 mg Oral TID  . insulin aspart  0-5 Units Subcutaneous QHS  . insulin aspart  0-9 Units Subcutaneous TID WC  . levofloxacin  750 mg Oral Q48H  . methylPREDNISolone (SOLU-MEDROL) injection  60 mg Intravenous TID  . metoprolol tartrate  25 mg Oral BID  . mirtazapine  15 mg Oral QHS  . mometasone-formoterol  2 puff Inhalation BID  . sodium chloride flush  3 mL Intravenous Q12H   Continuous Infusions:  PRN Meds:.benzonatate, guaiFENesin-dextromethorphan, HYDROcodone-homatropine, ipratropium-albuterol  Antibiotics  :    Anti-infectives    Start     Dose/Rate Route Frequency Ordered Stop   07/14/16 1000  levofloxacin (LEVAQUIN) tablet 750 mg     750 mg Oral Every 48 hours 07/13/16 1056     07/12/16 0800  vancomycin (VANCOCIN) IVPB 1000 mg/200 mL premix     1,000 mg 200 mL/hr over 60 Minutes Intravenous Every 24 hours 07/11/16 0720 07/13/16 0941   07/11/16 1400  piperacillin-tazobactam (ZOSYN) IVPB 3.375 g     3.375 g 12.5 mL/hr over 240 Minutes Intravenous Every 8 hours 07/11/16 0642 07/13/16 2359   07/11/16 0545  piperacillin-tazobactam (ZOSYN) IVPB 3.375 g     3.375 g 100 mL/hr over 30 Minutes Intravenous  Once 07/11/16 0539 07/11/16 0844   07/11/16 0545  vancomycin (VANCOCIN) IVPB 1000 mg/200 mL premix     1,000 mg 200 mL/hr over 60 Minutes Intravenous  Once 07/11/16 0539 07/11/16 0844         Objective:   Vitals:   07/12/16 0458 07/12/16 0808 07/12/16 0900 07/12/16 1100  BP: 135/73  104/78 127/72  Pulse: (!) 113  (!) 114 (!) 114  Resp: 20  (!) 28 21  Temp: 98.2 F (36.8 C)  98.3 F (36.8 C) 97.4 F (36.3 C)  TempSrc: Oral  Axillary Axillary  SpO2: 93% 94% 92% (!) 89%  Weight: 41.4 kg (91 lb 4.8 oz)     Height:  Wt Readings from Last 3 Encounters:  07/14/16 42.7 kg (94 lb 3.2 oz)  03/26/16 49.9 kg (110 lb)  12/20/15 48.1 kg (106 lb)     Intake/Output Summary (Last 24 hours) at 07/14/16 1109 Last data filed at 07/14/16 1028  Gross  per 24 hour  Intake              243 ml  Output              400 ml  Net             -157 ml     Physical Exam  Awake , Pleasantly confused, No new F.N deficits, Normal affect Ball.AT,PERRAL Supple Neck,No JVD, No cervical lymphadenopathy appriciated.  Symmetrical Chest wall movement, Good air movement bilaterally, Coarse B sounds RRR,No Gallops,Rubs or new Murmurs, No Parasternal Heave +ve B.Sounds, Abd Soft, No tenderness, No organomegaly appriciated, No rebound - guarding or rigidity. No Cyanosis, Clubbing or edema, No new Rash or bruise       Data Review:    CBC  Recent Labs Lab 07/11/16 0607 07/12/16 0526 07/13/16 0320 07/14/16 0337  WBC 30.5* 15.4* 8.0 11.4*  HGB 13.5 11.9* 12.3* 11.5*  HCT 42.2 38.6* 40.6 35.7*  PLT 180 184 205 204  MCV 88.1 88.1 89.0 89.9  MCH 28.2 27.2 27.0 29.0  MCHC 32.0 30.8 30.3 32.2  RDW 13.7 13.5 13.8 13.6  LYMPHSABS 0.6*  --   --   --   MONOABS 1.8*  --   --   --   EOSABS 0.0  --   --   --   BASOSABS 0.0  --   --   --     Chemistries   Recent Labs Lab 07/11/16 0607 07/12/16 0526 07/13/16 0320 07/14/16 0337  NA 138 139 137 137  K 4.7 3.1* 5.3* 4.7  CL 92* 96* 98* 96*  CO2 32 33* 29 34*  GLUCOSE 272* 169* 216* 246*  BUN 28* 16 15 19   CREATININE 1.00 0.65 0.65 0.65  CALCIUM 10.2 8.8* 9.1 8.8*  AST 146*  --   --   --   ALT 103*  --   --   --   ALKPHOS 87  --   --   --   BILITOT 1.0  --   --   --    ------------------------------------------------------------------------------------------------------------------ No results for input(s): CHOL, HDL, LDLCALC, TRIG, CHOLHDL, LDLDIRECT in the last 72 hours.  Lab Results  Component Value Date   HGBA1C 6.8 (H) 07/11/2016   ------------------------------------------------------------------------------------------------------------------ No results for input(s): TSH, T4TOTAL, T3FREE, THYROIDAB in the last 72 hours.  Invalid input(s):  FREET3 ------------------------------------------------------------------------------------------------------------------ No results for input(s): VITAMINB12, FOLATE, FERRITIN, TIBC, IRON, RETICCTPCT in the last 72 hours.  Coagulation profile  Recent Labs Lab 07/11/16 0753  INR 1.33    No results for input(s): DDIMER in the last 72 hours.  Cardiac Enzymes  Recent Labs Lab 07/11/16 1155 07/11/16 1756 07/12/16 0526  TROPONINI 0.17* 0.15* 0.22*   ------------------------------------------------------------------------------------------------------------------    Component Value Date/Time   BNP 283.8 (H) 07/11/2016 0607    Micro Results Recent Results (from the past 240 hour(s))  Blood Culture (routine x 2)     Status: None (Preliminary result)   Collection Time: 07/11/16  6:00 AM  Result Value Ref Range Status   Specimen Description BLOOD RIGHT ARM  Final   Special Requests BOTTLES DRAWN AEROBIC AND ANAEROBIC 5CC  Final   Culture NO GROWTH 2 DAYS  Final  Report Status PENDING  Incomplete  Blood Culture (routine x 2)     Status: None (Preliminary result)   Collection Time: 07/11/16  6:10 AM  Result Value Ref Range Status   Specimen Description BLOOD RIGHT HAND  Final   Special Requests IN PEDIATRIC BOTTLE 3CC  Final   Culture NO GROWTH 2 DAYS  Final   Report Status PENDING  Incomplete    Radiology Reports Ct Angio Chest Pe W Or Wo Contrast  Result Date: 07/12/2016 CLINICAL DATA:  Shortness of breath hypoxic respiratory failure EXAM: CT ANGIOGRAPHY CHEST WITH CONTRAST TECHNIQUE: Multidetector CT imaging of the chest was performed using the standard protocol during bolus administration of intravenous contrast. Multiplanar CT image reconstructions and MIPs were obtained to evaluate the vascular anatomy. CONTRAST:  80 mL Isovue 370 intravenous COMPARISON:  Chest x-ray 07/11/2016, 11/27/2015 FINDINGS: Cardiovascular: Satisfactory opacification of the pulmonary arteries to the  segmental level. No evidence of pulmonary embolism. Pulmonary arterial trunk is enlarged, measuring up to 3.6 cm in diameter. There are coronary artery calcifications. There is borderline cardiomegaly. No pericardial effusion. Atherosclerosis of the aorta. No aneurysm. Mediastinum/Nodes: Thyroid within normal limits. Trachea is midline. Esophagus is within normal limits. Moderate amount of pneumomediastinum with mediastinal air present around the aortic arch, great vessels, trachea, and around the heart. Dissection of small amount of air into the upper abdomen. Mild to moderate subcutaneous emphysema along the anterior chest wall with extension of soft tissue gas into the right lower neck. Scattered mediastinal lymph nodes.  No grossly enlarged hilar nodes. Lungs/Pleura: Mild to moderate emphysematous disease within the bilateral upper lobes. Bilateral areas of subpleural fibrosis, greatest within the bilateral lung bases. Multifocal ground-glass densities within the bilateral lower lobes, right middle lobe and upper lobe suspicious for pneumonia or pulmonary inflammatory process. No gross pneumothorax. Small left pleural effusion. Upper Abdomen: Colonic inter positioning in the right upper quadrant. Trace amount of gas dissecting into the upper abdominal cavity. Musculoskeletal: Degenerative changes. No acute or suspicious bone lesions. Review of the MIP images confirms the above findings. IMPRESSION: 1. No CT evidence for pulmonary embolus or aortic dissection 2. Mild to moderate emphysematous disease within the bilateral upper lobes. Bilateral areas of subpleural fibrosis. Multifocal ground-glass densities throughout all lobes of the lung, compatible with multifocal pneumonia or pulmonary inflammatory process. Small left pleural effusion. 3. Moderate amount of pneumomediastinum with largest concentration of mediastinal air present around the heart. Small amount of subcutaneous emphysema within the right and right  lower chest wall with dissection of small amount of air into the upper abdomen. Electronically Signed   By: Jasmine Pang M.D.   On: 07/12/2016 19:12   Dg Chest Port 1 View  Result Date: 07/11/2016 CLINICAL DATA:  82 y/o  M; CTR for 5 minutes with return of pulses. EXAM: PORTABLE CHEST 1 VIEW COMPARISON:  11/29/2015 chest radiograph FINDINGS: Coarse reticular markings in the lungs are similar to the prior chest radiograph and are consistent with pulmonary fibrosis. There are superimposed patchy airspace opacities which may represent pulmonary edema or multifocal pneumonia. Stable cardiac silhouette. No pleural effusion. Chronic right lateral rib fractures are present. IMPRESSION: Pulmonary fibrosis. Superimposed patchy airspace opacities may represent pulmonary edema or multifocal pneumonia. Electronically Signed   By: Mitzi Hansen M.D.   On: 07/11/2016 06:19    Time Spent in minutes  30   Maxmillian Carsey K M.D on 07/14/2016 at 11:09 AM  Between 7am to 7pm - Pager - [phone]  After 7pm go to  [email or web address] - password [facility]  [facility] -  Office  [phone]

## 2016-07-15 ENCOUNTER — Inpatient Hospital Stay (HOSPITAL_COMMUNITY): Payer: Medicare Other

## 2016-07-15 LAB — BASIC METABOLIC PANEL
ANION GAP: 9 (ref 5–15)
BUN: 26 mg/dL — ABNORMAL HIGH (ref 6–20)
CHLORIDE: 95 mmol/L — AB (ref 101–111)
CO2: 35 mmol/L — AB (ref 22–32)
CREATININE: 0.73 mg/dL (ref 0.61–1.24)
Calcium: 9.3 mg/dL (ref 8.9–10.3)
GFR calc non Af Amer: >60 mL/min (ref >60)
GLUCOSE: 266 mg/dL — AB (ref 65–99)
Potassium: 4.9 mmol/L (ref 3.5–5.1)
Sodium: 139 mmol/L (ref 135–145)

## 2016-07-15 LAB — CBC
HEMATOCRIT: 40.2 % (ref 39.0–52.0)
HEMOGLOBIN: 12.3 g/dL — AB (ref 13.0–17.0)
MCH: 26.9 pg (ref 26.0–34.0)
MCHC: 30.6 g/dL (ref 30.0–36.0)
MCV: 88 fL (ref 78.0–100.0)
Platelets: 205 10*3/uL (ref 150–400)
RBC: 4.57 MIL/uL (ref 4.22–5.81)
RDW: 13.5 % (ref 11.5–15.5)
WBC: 10.6 10*3/uL — ABNORMAL HIGH (ref 4.0–10.5)

## 2016-07-15 LAB — GLUCOSE, CAPILLARY
Glucose-Capillary: 279 mg/dL — ABNORMAL HIGH (ref 65–99)
Glucose-Capillary: 259 mg/dL — ABNORMAL HIGH (ref 65–99)

## 2016-07-15 MED ORDER — CEFUROXIME AXETIL 500 MG PO TABS: 500.0000 mg | ORAL_TABLET | Freq: Two times a day (BID) | ORAL | 0 refills | Status: AC

## 2016-07-15 MED ORDER — PREDNISONE 5 MG PO TABS: ORAL_TABLET | ORAL | 0 refills | Status: AC

## 2016-07-15 MED ORDER — PREDNISONE 5 MG PO TABS: 5.0000 mg | ORAL_TABLET | Freq: Every day | ORAL | Status: AC

## 2016-07-15 NOTE — Clinical Social Work Placement (Signed)
   CLINICAL SOCIAL WORK PLACEMENT  NOTE  Date:  07/15/2016  Patient Details  Name: Steven Pope MRN: [id number] Date of Birth: 02/05/1935  Clinical Social Work is seeking post-discharge placement for this patient at the Skilled  Nursing Facility level of care (*CSW will initial, date and re-position this form in  chart as items are completed):  Yes   Patient/family provided with [facility] Clinical Social Work Department's list of facilities offering this level of care within the geographic area requested by the patient (or if unable, by the patient's family).  Yes   Patient/family informed of their freedom to choose among providers that offer the needed level of care, that participate in Medicare, Medicaid or managed care program needed by the patient, have an available bed and are willing to accept the patient.  Yes   Patient/family informed of 's ownership interest in [facility] and [facility], as well as of the fact that they are under no obligation to receive care at these facilities.  PASRR submitted to EDS on       PASRR number received on       Existing PASRR number confirmed on 07/15/16     FL2 transmitted to all facilities in geographic area requested by pt/family on       FL2 transmitted to all facilities within larger geographic area on       Patient informed that his/her managed care company has contracts with or will negotiate with certain facilities, including the following:        Yes   Patient/family informed of bed offers received.  Patient chooses bed at  ([facility])     Physician recommends and patient chooses bed at      Patient to be transferred to  ([facility]) on 07/15/16.  Patient to be transferred to facility by  ([facility])     Patient family notified on 07/15/16 of transfer.  Name of family member notified:  Son - VM left     PHYSICIAN       Additional Comment:     _______________________________________________ Virdie Penning B, LCSWA 07/15/2016, 11:18 AM  

## 2016-07-15 NOTE — Clinical Social Work Placement (Deleted)
   CLINICAL SOCIAL WORK PLACEMENT  NOTE  Date:  07/15/2016  Patient Details  Name: Steven SchwabJuan Martin Vencill MRN: [id number] Date of Birth: 07/03/1934  Clinical Social Work is seeking post-discharge placement for this patient at the Skilled  Nursing Facility level of care (*CSW will initial, date and re-position this form in  chart as items are completed):  Yes   Patient/family provided with [facility] Clinical Social Work Department's list of facilities offering this level of care within the geographic area requested by the patient (or if unable, by the patient's family).  Yes   Patient/family informed of their freedom to choose among providers that offer the needed level of care, that participate in Medicare, Medicaid or managed care program needed by the patient, have an available bed and are willing to accept the patient.  Yes   Patient/family informed of [facility]'s ownership interest in [facility], as well as of the fact that they are under no obligation to receive care at these facilities.  PASRR submitted to EDS on       PASRR number received on       Existing PASRR number confirmed on 07/15/16     FL2 transmitted to all facilities in geographic area requested by pt/family on       FL2 transmitted to all facilities within larger geographic area on       Patient informed that his/her managed care company has contracts with or will negotiate with certain facilities, including the following:        Yes   Patient/family informed of bed offers received.  Patient [facility] bed at  [facility])     Physician recommends and patient chooses bed at      Patient to be [facility]) on 07/15/16.  Patient to be transferred to facility by  [facility])     Patient family notified on 07/15/16 of transfer.  Name of family member notified:  Son - VM left     PHYSICIAN       Additional Comment:     _______________________________________________ Norlene DuelBROWN, Bunyan Brier B, LCSWA 07/15/2016, 11:18 AM

## 2016-07-15 NOTE — Progress Notes (Signed)
[facility] accompanied by two [facility] personnel via stretcher. Discharge instructions given  To [facility] personnel. All personal belongings given. Telemetry box and IV removed prior to discharge and site in good condition. [facility].

## 2016-07-15 NOTE — Discharge Instructions (Signed)
Follow with Primary MD Nelson ChimesAMIN, SAAD, MD in 3-4 days   Get CBC, CMP, 2 view Chest X ray checked  by Primary MD or SNF MD in 3-4 days ( we routinely change or add medications that can affect your baseline labs and fluid status, therefore we recommend that you get the mentioned basic workup next visit with your PCP, your PCP may decide not to get them or add new tests based on their clinical decision)  Activity: As tolerated with Full fall precautions use walker/cane & assistance as needed  Disposition SNF  Diet:   Diet heart healthy/carb modified with feeding assistance and aspiration precautions.  For Heart failure patients - Check your Weight same time everyday, if you gain over 2 pounds, or you develop in leg swelling, experience more shortness of breath or chest pain, call your Primary MD immediately. Follow Cardiac Low Salt Diet and 1.5 lit/day fluid restriction.  On your next visit with your primary care physician please Get Medicines reviewed and adjusted.  Please request your Prim.MD to go over all Hospital Tests and Procedure/Radiological results at the follow up, please get all Hospital records sent to your Prim MD by signing hospital release before you go home.  If you experience worsening of your admission symptoms, develop shortness of breath, life threatening emergency, suicidal or homicidal thoughts you must seek medical attention immediately by calling 911 or calling your MD immediately  if symptoms less severe.  You Must read complete instructions/literature along with all the possible adverse reactions/side effects for all the Medicines you take and that have been prescribed to you. Take any new Medicines after you have completely understood and accpet all the possible adverse reactions/side effects.   Do not drive, operate heavy machinery, perform activities at heights, swimming or participation in water activities or provide baby sitting services if your were admitted for syncope  or siezures until you have seen by Primary MD or a Neurologist and advised to do so again.  Do not drive when taking Pain medications.    Do not take more than prescribed Pain, Sleep and Anxiety Medications  Special Instructions: If you have smoked or chewed Tobacco  in the last 2 yrs please stop smoking, stop any regular Alcohol  and or any Recreational drug use.  Wear Seat belts while driving.   Please note  You were cared for by a hospitalist during your hospital stay. If you have any questions about your discharge medications or the care you received while you were in the hospital after you are discharged, you can call the unit and asked to speak with the hospitalist on call if the hospitalist that took care of you is not available. Once you are discharged, your primary care physician will handle any further medical issues. Please note that NO REFILLS for any discharge medications will be authorized once you are discharged, as it is imperative that you return to your primary care physician (or establish a relationship with a primary care physician if you do not have one) for your aftercare needs so that they can reassess your need for medications and monitor your lab values.

## 2016-07-15 NOTE — Discharge Summary (Signed)
Steven Pope ZOX:[id number] DOB: 05/13/1935 DOA: 07/11/2016  PCP: Steven Pope, Steven Pope  Admit date: 07/11/2016  Discharge date: 07/15/2016  Admitted From: Steven Pope   Disposition:  SNf   Recommendations for Outpatient Follow-up:   Follow up with PCP in 1-2 weeks  PCP Please obtain BMP/CBC, 2 view CXR in 1week,  (see Discharge instructions)   PCP Please follow up on the following pending results: Follow-up with his primary pulmonologist within a week after discharge.   Home Health: None   Equipment/Devices: None  Consultations: none Discharge Condition: Guarded   CODE STATUS: Full   Diet Recommendation:  Heart Healthy    Chief Complaint  Patient presents with  . Loss of Consciousness     Brief history of present illness from the day of admission and additional interim summary    Steven Pope a 81 y.o.malewith medical history significant for diabetes, hypertension, Alzheimer's, COPD on oxygen, Suspicion for underlying pulmonary fibrosis was admitted for acute hypoxic respiratory failure causing metabolic encephalopathy due to pneumonia.                                                                 Hospital Course    1. Acute on chronic hypoxic respiratory failure due to pneumonia causing metabolic encephalopathy - currently on empiric antibiotics, oxygen along with nebulizer treatments, leukocytosis improved, sepsis physiology seems to have resolved, he appears nontoxic, improved breath sounds, continue nebulizer treatments, flutter valve and supportive care. His baseline appears to be quite poor and currently he says he is getting close to his baseline.  Based upon his sputum cultures he has been placed on Vantin or total of 10 day course, continue prednisone taper, continue home dose oxygen and supportive  care at [facility]. Continue using flutter valve while he is awake to 3-4 times every hour. Needs feeding assistance and aspiration precautions.  2. Suspicion for underlying pulmonary fibrosis - CT chest rules out PE, confirms patchy pulmonary fibrosis, note he is on 5 mg of prednisone chronically. Continue outpatient pulmonary follow-up with his primary pulmonologist Steven Pope.  3. Mild elevation in troponin. Chest pain-free, troponin rise is flat and in non-ACS pattern likely due to demand ischemia from #1 above. Continue aspirin, statin and beta blocker for secondary prevention, stable echocardiogram preserved EF of 50% and no reported wall motion abnormality. He is chest pain-free and EKG is nonacute.  4. Hypertension. Continue Lopressor.  5. Dyslipidemia. On statin.   6. Incidental finding of pneumomediastinum on CT scan of the chest. Discussed with radiologist, there is no fluid collection or inflammation suggesting this is air leak from coughing and pulmonary source. Recommended to follow clinically only. Patient is symptom free.She began repeat two-view chest x-ray next visit.  7. Underlying dementia and mild delirium due to acute sickness. Supportive care.  Minimize benzodiazepines and narcotics.   8. DM type II. Resume home regimen check CBGs before every meal [facility].     Discharge diagnosis     Principal Problem:   HCAP (healthcare-associated pneumonia) Active Problems:   Sepsis (HCC)   Alzheimer disease   COPD (chronic obstructive pulmonary disease) (HCC)   Pulmonary fibrosis (HCC)   Essential hypertension   Type 2 diabetes mellitus without complication, without long-term current use of insulin (HCC)   Severe protein-calorie malnutrition (HCC)   Chronic respiratory failure (HCC)   Acute encephalopathy   Leukocytosis    Discharge instructions    Discharge Instructions    Discharge instructions    Complete by:  As directed    Follow with Primary Steven Pope Steven Pope, Steven Pope, Steven Pope in  3-4 days   Get CBC, CMP, 2 view Chest X ray checked  by Primary Steven Pope or SNF Steven Pope in 3-4 days ( we routinely change or add medications that can affect your baseline labs and fluid status, therefore we recommend that you get the mentioned basic workup next visit with your PCP, your PCP may decide not to get them or add new tests based on their clinical decision)  Activity: As tolerated with Full fall precautions use walker/cane & assistance as needed  Disposition SNF  Diet:   Diet heart healthy/carb modified with feeding assistance and aspiration precautions.  For Heart failure patients - Check your Weight same time everyday, if you gain over 2 pounds, or you develop in leg swelling, experience more shortness of breath or chest pain, call your Primary Steven Pope immediately. Follow Cardiac Low Salt Diet and 1.5 lit/day fluid restriction.  On your next visit with your primary care physician please Get Medicines reviewed and adjusted.  Please request your Prim.Steven Pope to go over all Hospital Tests and Procedure/Radiological results at the follow up, please get all Hospital records sent to your Prim Steven Pope by signing hospital release before you go home.  If you experience worsening of your admission symptoms, develop shortness of breath, life threatening emergency, suicidal or homicidal thoughts you must seek medical attention immediately by calling 911 or calling your Steven Pope immediately  if symptoms less severe.  You Must read complete instructions/literature along with all the possible adverse reactions/side effects for all the Medicines you take and that have been prescribed to you. Take any new Medicines after you have completely understood and accpet all the possible adverse reactions/side effects.   Do not drive, operate heavy machinery, perform activities at heights, swimming or participation in water activities or provide baby sitting services if your were admitted for syncope or siezures until you have seen by  Primary Steven Pope or a Neurologist and advised to do so again.  Do not drive when taking Pain medications.    Do not take more than prescribed Pain, Sleep and Anxiety Medications  Special Instructions: If you have smoked or chewed Tobacco  in the last 2 yrs please stop smoking, stop any regular Alcohol  and or any Recreational drug use.  Wear Seat belts while driving.   Please note  You were cared for by a hospitalist during your hospital stay. If you have any questions about your discharge medications or the care you received while you were in the hospital after you are discharged, you can call the unit and asked to speak with the hospitalist on call if the hospitalist that took care of you is not available. Once you are discharged, your primary care physician will handle any further medical  issues. Please note that NO REFILLS for any discharge medications will be authorized once you are discharged, as it is imperative that you return to your primary care physician (or establish a relationship with a primary care physician if you do not have one) for your aftercare needs so that they can reassess your need for medications and monitor your lab values.   Increase activity slowly    Complete by:  As directed       Discharge Medications   Allergies as of 07/15/2016   No Known Allergies     Medication List    STOP taking these medications   levofloxacin 750 MG tablet Commonly known as:  LEVAQUIN     TAKE these medications   acetaminophen 325 MG tablet Commonly known as:  TYLENOL Take 650 mg by mouth every 6 (six) hours as needed for fever.   amLODipine 5 MG tablet Commonly known as:  NORVASC Take 1 tablet (5 mg total) by mouth daily.   aspirin EC 81 MG tablet Take 1 tablet (81 mg total) by mouth daily.   atorvastatin 40 MG tablet Commonly known as:  LIPITOR Take 40 mg by mouth every morning.   cefUROXime 500 MG tablet Commonly known as:  CEFTIN Take 1 tablet (500 mg total) by  mouth 2 (two) times daily with a meal.   cetirizine 10 MG tablet Commonly known as:  ZYRTEC Take 10 mg by mouth every morning.   cholecalciferol 1000 units tablet Commonly known as:  VITAMIN D Take 1,000 Units by mouth daily.   dextromethorphan-guaiFENesin 10-100 MG/5ML liquid Commonly known as:  ROBITUSSIN-DM Take 10 mLs by mouth every 6 (six) hours as needed for cough.   diclofenac sodium 1 % Gel Commonly known as:  VOLTAREN Apply 1 g topically 2 (two) times daily.   DULERA 200-5 MCG/ACT Aero Generic drug:  mometasone-formoterol Inhale 2 puffs into the lungs 2 (two) times daily.   feeding supplement (GLUCERNA SHAKE) Liqd Take 237 mLs by mouth every evening. Strawberry   gabapentin 100 MG capsule Commonly known as:  NEURONTIN Take 100 mg by mouth 3 (three) times daily.   GLUCAGON EMERGENCY 1 MG injection Generic drug:  glucagon Inject 1 mg into the vein once as needed (for hypoglycemia (BS <60 mg/dl) if patient unable to swallow or unconscious).   guaiFENesin 600 MG 12 hr tablet Commonly known as:  MUCINEX Take 1 tablet (600 mg total) by mouth 2 (two) times daily.   ipratropium-albuterol 0.5-2.5 (3) MG/3ML Soln Commonly known as:  DUONEB Take 3 mLs by nebulization every 6 (six) hours. 3 ml inhale orally every 6 hours related to COPD exacerbation.  Rinse mouth with water.  Do not swallow.   metFORMIN 500 MG tablet Commonly known as:  GLUCOPHAGE Take 500-1,000 mg by mouth 2 (two) times daily with a meal. Take 2 tablets in the morning and 2 tables in the evening.   metoprolol tartrate 25 MG tablet Commonly known as:  LOPRESSOR Take 1 tablet (25 mg total) by mouth 2 (two) times daily.   mirtazapine 15 MG tablet Commonly known as:  REMERON Take 15 mg by mouth at bedtime.   multivitamin tablet Take 1 tablet by mouth every morning.   NOVOLOG FLEXPEN 100 UNIT/ML FlexPen Generic drug:  insulin aspart Inject 0-8 Units into the skin 4 (four) times daily -  before meals  and at bedtime. 201 - 250 = 2 units 251 - 300 = 4 units 301 - 350 = 6 units 351 -  400 = 8 units If BS < 60, call Steven Pope If BS > 400, give 10 units and call Steven Pope   OXYGEN Inhale 2 L into the lungs continuous.   predniSONE 5 MG tablet Commonly known as:  DELTASONE Label  & dispense according to the schedule below. 10 Pills PO for 3 days then, 8 Pills PO for 3 days, 6 Pills PO for 3 days, 4 Pills PO for 3 days, 2 Pills PO for 3 days, then continue taking 1 pill of 5 mg daily as before. What changed:  You were already taking a medication with the same name, and this prescription was added. Make sure you understand how and when to take each.   predniSONE 5 MG tablet Commonly known as:  DELTASONE Take 1 tablet (5 mg total) by mouth daily with breakfast. Resume after high-dose prednisone taper has come down to 5 mg daily Start taking on:  07/26/2016 What changed:  additional instructions   sennosides-docusate sodium 8.6-50 MG tablet Commonly known as:  SENOKOT-S Take 1 tablet by mouth 2 (two) times daily.   sorbitol 70 % solution Take 30 mLs by mouth daily as needed (for constipation).       Follow-up Information    AMIN, Steven Pope, Steven Pope. Schedule an appointment as soon as possible for a visit in 3 day(s).   Specialty:  Internal Medicine Contact information: [address] [place] [address] [phone]           Major procedures and Radiology Reports - PLEASE review detailed and final reports thoroughly  -     CTA - no PE, patchy for a fibrosis, pneumomediastinum without any fluid or inflammation.  TTE - Study Conclusions  - Left ventricle: The estimated ejection fraction was = 50%. - Right ventricle: The cavity size was dilated. - Right atrium: The atrium was dilated.  Recommendations: Consider transesophageal echocardiography if clinically indicated in order to better evaluate.   Ct Angio Chest Pe W Or Wo Contrast  Result Date: 07/12/2016 CLINICAL DATA:  Shortness of  breath hypoxic respiratory failure EXAM: CT ANGIOGRAPHY CHEST WITH CONTRAST TECHNIQUE: Multidetector CT imaging of the chest was performed using the standard protocol during bolus administration of intravenous contrast. Multiplanar CT image reconstructions and MIPs were obtained to evaluate the vascular anatomy. CONTRAST:  80 mL Isovue 370 intravenous COMPARISON:  Chest x-ray 07/11/2016, 11/27/2015 FINDINGS: Cardiovascular: Satisfactory opacification of the pulmonary arteries to the segmental level. No evidence of pulmonary embolism. Pulmonary arterial trunk is enlarged, measuring up to 3.6 cm in diameter. There are coronary artery calcifications. There is borderline cardiomegaly. No pericardial effusion. Atherosclerosis of the aorta. No aneurysm. Mediastinum/Nodes: Thyroid within normal limits. Trachea is midline. Esophagus is within normal limits. Moderate amount of pneumomediastinum with mediastinal air present around the aortic arch, great vessels, trachea, and around the heart. Dissection of small amount of air into the upper abdomen. Mild to moderate subcutaneous emphysema along the anterior chest wall with extension of soft tissue gas into the right lower neck. Scattered mediastinal lymph nodes.  No grossly enlarged hilar nodes. Lungs/Pleura: Mild to moderate emphysematous disease within the bilateral upper lobes. Bilateral areas of subpleural fibrosis, greatest within the bilateral lung bases. Multifocal ground-glass densities within the bilateral lower lobes, right middle lobe and upper lobe suspicious for pneumonia or pulmonary inflammatory process. No gross pneumothorax. Small left pleural effusion. Upper Abdomen: Colonic inter positioning in the right upper quadrant. Trace amount of gas dissecting into the upper abdominal cavity. Musculoskeletal: Degenerative  changes. No acute or suspicious bone lesions. Review of the MIP images confirms the above findings. IMPRESSION: 1. No CT evidence for pulmonary  embolus or aortic dissection 2. Mild to moderate emphysematous disease within the bilateral upper lobes. Bilateral areas of subpleural fibrosis. Multifocal ground-glass densities throughout all lobes of the lung, compatible with multifocal pneumonia or pulmonary inflammatory process. Small left pleural effusion. 3. Moderate amount of pneumomediastinum with largest concentration of mediastinal air present around the heart. Small amount of subcutaneous emphysema within the right and right lower chest wall with dissection of small amount of air into the upper abdomen. Electronically Signed   By: Jasmine Pang M.D.   On: 07/12/2016 19:12   Dg Chest Port 1 View  Result Date: 07/15/2016 CLINICAL DATA:  Shortness of Breath EXAM: PORTABLE CHEST 1 VIEW COMPARISON:  07/11/2016 FINDINGS: Patchy bilateral airspace disease, most confluent in the left lower lobe and right upper lobe, slightly improved since prior study. Mild cardiomegaly. No visible effusions. IMPRESSION: Some improvement in patchy bilateral airspace disease since prior study. Electronically Signed   By: Charlett Nose M.D.   On: 07/15/2016 07:41   Dg Chest Port 1 View  Result Date: 07/11/2016 CLINICAL DATA:  82 y/o  M; CTR for 5 minutes with return of pulses. EXAM: PORTABLE CHEST 1 VIEW COMPARISON:  11/29/2015 chest radiograph FINDINGS: Coarse reticular markings in the lungs are similar to the prior chest radiograph and are consistent with pulmonary fibrosis. There are superimposed patchy airspace opacities which may represent pulmonary edema or multifocal pneumonia. Stable cardiac silhouette. No pleural effusion. Chronic right lateral rib fractures are present. IMPRESSION: Pulmonary fibrosis. Superimposed patchy airspace opacities may represent pulmonary edema or multifocal pneumonia. Electronically Signed   By: Mitzi Hansen M.D.   On: 07/11/2016 06:19    Micro Results     Recent Results (from the past 240 hour(s))  Blood Culture  (routine x 2)     Status: None (Preliminary result)   Collection Time: 07/11/16  6:00 AM  Result Value Ref Range Status   Specimen Description BLOOD RIGHT ARM  Final   Special Requests BOTTLES DRAWN AEROBIC AND ANAEROBIC 5CC  Final   Culture  Setup Time   Final    GRAM NEGATIVE RODS IN BOTH AEROBIC AND ANAEROBIC BOTTLES CRITICAL RESULT CALLED TO, READ BACK BY AND VERIFIED WITH: H BAIRD,PHARMD AT 1745 07/14/16 BY L BENFIELD    Culture GRAM NEGATIVE RODS  Final   Report Status PENDING  Incomplete  Blood Culture ID Panel (Reflexed)     Status: Abnormal   Collection Time: 07/11/16  6:00 AM  Result Value Ref Range Status   Enterococcus species NOT DETECTED NOT DETECTED Final   Listeria monocytogenes NOT DETECTED NOT DETECTED Final   Staphylococcus species NOT DETECTED NOT DETECTED Final   Staphylococcus aureus NOT DETECTED NOT DETECTED Final   Streptococcus species NOT DETECTED NOT DETECTED Final   Streptococcus agalactiae NOT DETECTED NOT DETECTED Final   Streptococcus pneumoniae NOT DETECTED NOT DETECTED Final   Streptococcus pyogenes NOT DETECTED NOT DETECTED Final   Acinetobacter baumannii NOT DETECTED NOT DETECTED Final   Enterobacteriaceae species DETECTED (A) NOT DETECTED Final    Comment: Enterobacteriaceae represent a large family of gram-negative bacteria, not a single organism. CRITICAL RESULT CALLED TO, READ BACK BY AND VERIFIED WITH: H BAIRD,PHARMD AT 1745 07/14/16 BY L BENFIELD    Enterobacter cloacae complex NOT DETECTED NOT DETECTED Final   Escherichia coli NOT DETECTED NOT DETECTED Final   Klebsiella oxytoca  NOT DETECTED NOT DETECTED Final   Klebsiella pneumoniae DETECTED (A) NOT DETECTED Final    Comment: CRITICAL RESULT CALLED TO, READ BACK BY AND VERIFIED WITH: H BAIRD,PHARMD AT 1745 07/14/16 BY L BENFIELD    Proteus species NOT DETECTED NOT DETECTED Final   Serratia marcescens NOT DETECTED NOT DETECTED Final   Carbapenem resistance NOT DETECTED NOT DETECTED Final    Haemophilus influenzae NOT DETECTED NOT DETECTED Final   Neisseria meningitidis NOT DETECTED NOT DETECTED Final   Pseudomonas aeruginosa NOT DETECTED NOT DETECTED Final   Candida albicans NOT DETECTED NOT DETECTED Final   Candida glabrata NOT DETECTED NOT DETECTED Final   Candida krusei NOT DETECTED NOT DETECTED Final   Candida parapsilosis NOT DETECTED NOT DETECTED Final   Candida tropicalis NOT DETECTED NOT DETECTED Final  Blood Culture (routine x 2)     Status: None (Preliminary result)   Collection Time: 07/11/16  6:10 AM  Result Value Ref Range Status   Specimen Description BLOOD RIGHT HAND  Final   Special Requests IN PEDIATRIC BOTTLE 3CC  Final   Culture NO GROWTH 3 DAYS  Final   Report Status PENDING  Incomplete    Today   Subjective    Garison Genova today has no headache,no chest abdominal pain,no new weakness tingling or numbness, feels much better    Objective   Blood pressure (!) 170/91, pulse 88, temperature 97.8 F (36.6 C), temperature source Oral, resp. rate 20, height 5' (1.524 m), weight 45.2 kg (99 lb 11.2 oz), SpO2 96 %.   Intake/Output Summary (Last 24 hours) at 07/15/16 1047 Last data filed at 07/15/16 0530  Gross per 24 hour  Intake              360 ml  Output              950 ml  Net             -590 ml    Exam Awake Pleasantly confused, No new F.N deficits, Normal affect [place].AT,PERRAL Supple Neck,No JVD, No cervical lymphadenopathy appriciated.  Symmetrical Chest wall movement, Good air movement bilaterally, few rales RRR,No Gallops,Rubs or new Murmurs, No Parasternal Heave +ve B.Sounds, Abd Soft, Non tender, No organomegaly appriciated, No rebound -guarding or rigidity. No Cyanosis, Clubbing or edema, No new Rash or bruise   Data Review   CBC w Diff: Lab Results  Component Value Date   WBC 10.6 (H) 07/15/2016   HGB 12.3 (L) 07/15/2016   HCT 40.2 07/15/2016   PLT 205 07/15/2016   LYMPHOPCT 2 07/11/2016   MONOPCT 6 07/11/2016   EOSPCT 0  07/11/2016   BASOPCT 0 07/11/2016    CMP: Lab Results  Component Value Date   NA 139 07/15/2016   K 4.9 07/15/2016   CL 95 (L) 07/15/2016   CO2 35 (H) 07/15/2016   BUN 26 (H) 07/15/2016   CREATININE 0.73 07/15/2016   PROT 8.3 (H) 07/11/2016   ALBUMIN 3.0 (L) 07/11/2016   BILITOT 1.0 07/11/2016   ALKPHOS 87 07/11/2016   AST 146 (H) 07/11/2016   ALT 103 (H) 07/11/2016  .   Total Time in preparing paper work, data evaluation and todays exam - 35 minutes  Leroy Sea M.D on 07/15/2016 at 10:47 AM  [facility]   Office  [phone]

## 2016-07-15 NOTE — Clinical Social Work Note (Signed)
Clinical Social Worker facilitated patient discharge including contacting patient (Son) family and facility to confirm patient discharge plans.  Clinical information faxed to facility and family agreeable with plan.  CSW arranged ambulance transport via [facility] to [facility]. RN to call report [phone] prior to discharge.  Clinical Social Worker will sign off for now as social work intervention is no longer needed. Please consult us again if new need arises.  Keyonte Cookston B. Gean QuintBrown,MSW, LCSWA Clinical Social Work Dept Weekend Social Worker [phone] 11:14 AM

## 2016-07-15 NOTE — Progress Notes (Signed)
Prescriptions for Ceftin and Prednisone faxed and successfully  transmitted to [facility]).

## 2016-07-16 LAB — CULTURE, BLOOD (ROUTINE X 2): CULTURE: NO GROWTH

## 2016-07-31 ENCOUNTER — Ambulatory Visit: Payer: Medicare Other | Admitting: Pulmonary Disease

## 2016-08-16 ENCOUNTER — Encounter: Payer: Self-pay | Admitting: Physician Assistant

## 2016-08-16 ENCOUNTER — Encounter (HOSPITAL_COMMUNITY): Payer: Self-pay | Admitting: Emergency Medicine

## 2016-08-16 ENCOUNTER — Emergency Department (HOSPITAL_COMMUNITY)
Admission: EM | Admit: 2016-08-16 | Discharge: 2016-09-02 | Disposition: E | Payer: Medicare Other | Attending: Emergency Medicine | Admitting: Emergency Medicine

## 2016-08-16 ENCOUNTER — Emergency Department (HOSPITAL_COMMUNITY): Payer: Medicare Other

## 2016-08-16 DIAGNOSIS — Z7982 Long term (current) use of aspirin: Secondary | ICD-10-CM | POA: Insufficient documentation

## 2016-08-16 DIAGNOSIS — G309 Alzheimer's disease, unspecified: Secondary | ICD-10-CM | POA: Insufficient documentation

## 2016-08-16 DIAGNOSIS — F0280 Dementia in other diseases classified elsewhere without behavioral disturbance: Secondary | ICD-10-CM | POA: Diagnosis not present

## 2016-08-16 DIAGNOSIS — E872 Acidosis, unspecified: Secondary | ICD-10-CM

## 2016-08-16 DIAGNOSIS — J449 Chronic obstructive pulmonary disease, unspecified: Secondary | ICD-10-CM | POA: Diagnosis not present

## 2016-08-16 DIAGNOSIS — E119 Type 2 diabetes mellitus without complications: Secondary | ICD-10-CM | POA: Diagnosis not present

## 2016-08-16 DIAGNOSIS — I469 Cardiac arrest, cause unspecified: Secondary | ICD-10-CM | POA: Diagnosis present

## 2016-08-16 DIAGNOSIS — Z79899 Other long term (current) drug therapy: Secondary | ICD-10-CM | POA: Diagnosis not present

## 2016-08-16 DIAGNOSIS — Z87891 Personal history of nicotine dependence: Secondary | ICD-10-CM | POA: Diagnosis not present

## 2016-08-16 DIAGNOSIS — R4189 Other symptoms and signs involving cognitive functions and awareness: Secondary | ICD-10-CM

## 2016-08-16 DIAGNOSIS — Z794 Long term (current) use of insulin: Secondary | ICD-10-CM | POA: Diagnosis not present

## 2016-08-16 DIAGNOSIS — I10 Essential (primary) hypertension: Secondary | ICD-10-CM | POA: Diagnosis not present

## 2016-08-16 LAB — I-STAT ARTERIAL BLOOD GAS, ED
ACID-BASE DEFICIT: 10 mmol/L — AB (ref 0.0–2.0)
Bicarbonate: 19.5 mmol/L — ABNORMAL LOW (ref 20.0–28.0)
O2 SAT: 100 %
PCO2 ART: 60.7 mmHg — AB (ref 32.0–48.0)
TCO2: 21 mmol/L (ref 0–100)
pH, Arterial: 7.115 — CL (ref 7.350–7.450)
pO2, Arterial: 325 mmHg — ABNORMAL HIGH (ref 83.0–108.0)

## 2016-08-16 LAB — COMPREHENSIVE METABOLIC PANEL
ALBUMIN: 2 g/dL — AB (ref 3.5–5.0)
ALT: 1397 U/L — ABNORMAL HIGH (ref 17–63)
AST: 2128 U/L — AB (ref 15–41)
Alkaline Phosphatase: 160 U/L — ABNORMAL HIGH (ref 38–126)
Anion gap: 21 — ABNORMAL HIGH (ref 5–15)
BUN: 46 mg/dL — AB (ref 6–20)
CHLORIDE: 103 mmol/L (ref 101–111)
CO2: 21 mmol/L — AB (ref 22–32)
Calcium: 11.2 mg/dL — ABNORMAL HIGH (ref 8.9–10.3)
Creatinine, Ser: 1.53 mg/dL — ABNORMAL HIGH (ref 0.61–1.24)
GFR calc Af Amer: 48 mL/min — ABNORMAL LOW (ref >60)
GFR, EST NON AFRICAN AMERICAN: 41 mL/min — AB (ref >60)
Glucose, Bld: 122 mg/dL — ABNORMAL HIGH (ref 65–99)
POTASSIUM: 6.4 mmol/L — AB (ref 3.5–5.1)
SODIUM: 145 mmol/L (ref 135–145)
Total Bilirubin: 1 mg/dL (ref 0.3–1.2)
Total Protein: 4.8 g/dL — ABNORMAL LOW (ref 6.5–8.1)

## 2016-08-16 LAB — CBC WITH DIFFERENTIAL/PLATELET
BASOS ABS: 0.1 10*3/uL (ref 0.0–0.1)
BASOS PCT: 0 %
EOS ABS: 0 10*3/uL (ref 0.0–0.7)
EOS PCT: 0 %
HCT: 35.7 % — ABNORMAL LOW (ref 39.0–52.0)
Hemoglobin: 10.4 g/dL — ABNORMAL LOW (ref 13.0–17.0)
Lymphocytes Relative: 39 %
Lymphs Abs: 5.4 10*3/uL — ABNORMAL HIGH (ref 0.7–4.0)
MCH: 26.8 pg (ref 26.0–34.0)
MCHC: 29.1 g/dL — ABNORMAL LOW (ref 30.0–36.0)
MCV: 92 fL (ref 78.0–100.0)
MONO ABS: 0.4 10*3/uL (ref 0.1–1.0)
Monocytes Relative: 3 %
Neutro Abs: 7.8 10*3/uL — ABNORMAL HIGH (ref 1.7–7.7)
Neutrophils Relative %: 57 %
PLATELETS: 109 10*3/uL — AB (ref 150–400)
RBC: 3.88 MIL/uL — ABNORMAL LOW (ref 4.22–5.81)
RDW: 15.4 % (ref 11.5–15.5)
WBC: 13.7 10*3/uL — AB (ref 4.0–10.5)

## 2016-08-16 LAB — CBG MONITORING, ED: GLUCOSE-CAPILLARY: 191 mg/dL — AB (ref 65–99)

## 2016-08-16 LAB — I-STAT CG4 LACTIC ACID, ED: Lactic Acid, Venous: 16.65 mmol/L (ref 0.5–1.9)

## 2016-08-16 LAB — I-STAT TROPONIN, ED: Troponin i, poc: 0.11 ng/mL (ref 0.00–0.08)

## 2016-08-16 MED ORDER — DEXTROSE 5 % IV SOLN
0.5000 ug/min | INTRAVENOUS | Status: DC
  Administered 2016-08-16: 1 ug/min via INTRAVENOUS
  Filled 2016-08-16: qty 4

## 2016-08-16 MED ORDER — PHENYLEPHRINE HCL 10 MG/ML IJ SOLN
0.0000 ug/min | Freq: Once | INTRAVENOUS | Status: DC
  Filled 2016-08-16: qty 1

## 2016-08-16 MED ORDER — SODIUM CHLORIDE 0.9 % IV SOLN
1.0000 g | Freq: Once | INTRAVENOUS | Status: AC
  Administered 2016-08-16: 1 g via INTRAVENOUS
  Filled 2016-08-16: qty 10

## 2016-08-16 MED ORDER — EPINEPHRINE PF 1 MG/10ML IJ SOSY
PREFILLED_SYRINGE | INTRAMUSCULAR | Status: DC | PRN
  Administered 2016-08-16: 1 mg via INTRAVENOUS
  Administered 2016-08-16: 0.5 mg via INTRAVENOUS

## 2016-08-16 MED ORDER — EPINEPHRINE PF 1 MG/10ML IJ SOSY
PREFILLED_SYRINGE | INTRAMUSCULAR | Status: AC | PRN
  Administered 2016-08-16 (×2): 1 mg via INTRAVENOUS

## 2016-08-16 MED ORDER — SODIUM BICARBONATE 8.4 % IV SOLN
INTRAVENOUS | Status: AC | PRN
  Administered 2016-08-16: 100 meq via INTRAVENOUS

## 2016-08-16 MED ORDER — PHENYLEPHRINE HCL 10 MG/ML IJ SOLN
0.0000 ug/min | INTRAMUSCULAR | Status: DC
  Filled 2016-08-16: qty 1

## 2016-08-16 MED ORDER — SODIUM CHLORIDE 0.9 % IV SOLN
INTRAVENOUS | Status: DC | PRN
  Administered 2016-08-16 (×2): 1000 mL via INTRAVENOUS

## 2016-08-16 MED ORDER — ATROPINE SULFATE 1 MG/ML IJ SOLN
INTRAMUSCULAR | Status: DC | PRN
  Administered 2016-08-16 (×3): 1 mg via INTRAVENOUS

## 2016-08-16 MED ORDER — CALCIUM CHLORIDE 10 % IV SOLN
INTRAVENOUS | Status: AC | PRN
  Administered 2016-08-16: 1 g via INTRAVENOUS

## 2016-08-16 MED ORDER — SODIUM BICARBONATE 8.4 % IV SOLN
INTRAVENOUS | Status: DC | PRN
  Administered 2016-08-16: 100 meq via INTRAVENOUS

## 2016-08-21 LAB — CULTURE, BLOOD (ROUTINE X 2)
CULTURE: NO GROWTH
Culture: NO GROWTH

## 2016-08-22 ENCOUNTER — Ambulatory Visit: Payer: Medicare Other | Admitting: Physician Assistant

## 2016-09-02 NOTE — Code Documentation (Signed)
CPR initiated

## 2016-09-02 NOTE — ED Notes (Signed)
Port chest xray at bedside 

## 2016-09-02 NOTE — ED Provider Notes (Signed)
I saw and evaluated the patient, reviewed the resident's note and I agree with the findings and plan with the following exceptions.   This is an 82 year old male with significant past medical history includes severe pulmonary fibrosis, dementia, COPD, hypertension, diabetes, malnutrition recently admitted after having had severe sepsis and pneumonia. He was found down at his facility approximately 30 minutes after last seen well. EMSs arrival patient was in PEA and over the next 25 minutes gave 5 rounds of epinephrine. had gotten V. tach without pulses once which they shocked, gave 450 of amiodarone and were still in PEA. They called me for orders and gave calcium and bicarbonate shortly after patient had ROSC. Patient was brought here for further evaluation. On initial examination patient is a chronically ill cachectic appearing man. Had normal blood pressures and sinus rhythm on the monitor. Glucose normal. ett already placed and had bilateral breath sounds with good waveforms and pulse ox. Pupils fixed and dilated.  A code STEMI was called based off of a prior EKG but canceled after cardiology evaluation. Approximately 10 minutes after arriving here the patient lost pulses again. Over the next 30-40 minutes patient had multiple cardiac arrests. Family arrived and I had a lengthy discussion with them, they were informed of the dire situation and agreed to make the patient a DO NOT RESUSCITATE. He was kept on the ventilator and an epinephrine drip with the addition of a calcium and phenylephrine drip until the final family member arrived and the patient passed, time of death of 2150. Suspect likely cardiac cause.    Cardiopulmonary Resuscitation (CPR) Procedure Note Directed/Performed by: Steven Pope, Steven Pope I personally directed ancillary staff and/or performed CPR in an effort to regain return of spontaneous circulation and to maintain cardiac, neuro and systemic perfusion.   CRITICAL CARE Performed by:  Steven Pope, Steven Pope Total critical care time: 75 minutes Critical care time was exclusive of separately billable procedures and treating other patients. Critical care was necessary to treat or prevent imminent or life-threatening deterioration. Critical care was time spent personally by me on the following activities: development of treatment plan with patient and/or surrogate as well as nursing, discussions with consultants, evaluation of patient's response to treatment, examination of patient, obtaining history from patient or surrogate, ordering and performing treatments and interventions, ordering and review of laboratory studies, ordering and review of radiographic studies, pulse oximetry and re-evaluation of patient's condition.    EKG Interpretation  Date/Time:  Thursday August 16 2016 21:53:14 EDT Ventricular Rate:  0 PR Interval:    QRS Duration: 127 QT Interval:  415 QTC Calculation: 442 R Axis:   0 Text Interpretation:  Uncertain rhythm: review No further analysis attempted - not enough leads could be measured Confirmed by [facility] MD, Steven Pope [phone] on 07/10/16 10:14:48 PM         Steven MemosJason Anibal Quinby, MD 08/17/16 [phone]

## 2016-09-02 NOTE — ED Provider Notes (Signed)
[facility]-EMERGENCY DEPT Provider Note   CSN: [id number] Arrival date & time: 08-25-16  2001     History   Chief Complaint Chief Complaint  Patient presents with  . Cardiac Arrest    HPI Steven Pope is a 82 y.o. male PMH dementia, pulmonary fibrosis, COPD, DM, recently admitted for cardiac arrest s/p PNA presents via EMS for cardiac arrest. Pt was found down at facility 30 minutes after last seen well. PEA on EMS arrival. Given 5 rounds of epinephrine, became VTach w/o pulses. Shocked once and given 450 amiodarone and return to PEA. ROSC achieved shortly after giving calcium and bicarb. ACLS lasted approx 25 minutes. Intubated in field.  The history is provided by the EMS personnel and medical records.  Cardiac Arrest  Witnessed by:  Not witnessed Incident location:  Nursing home Time before BLS initiated:  > 5 minutes Time before ALS initiated:  > 10 minutes Condition upon EMS arrival:  Apneic and unresponsive Pulse:  Absent Initial cardiac rhythm per EMS:  PEA Airway:  Intubation prior to arrival Rhythm on admission to ED:  Paced Risk factors: COPD and diabetes mellitus   Risk factors: no head injury and no trauma     Past Medical History:  Diagnosis Date  . Alzheimer disease   . Chronic respiratory failure (HCC)   . Constipation   . COPD (chronic obstructive pulmonary disease) (HCC)   . Cough   . Diabetes mellitus without complication (HCC)   . HCAP (healthcare-associated pneumonia) 07/11/2016  . Hypertension   . Osteoarthritis   . Pulmonary fibrosis (HCC)   . Syphilis     Patient Active Problem List   Diagnosis Date Noted  . Respiratory distress 07/11/2016  . Acute encephalopathy 07/11/2016  . Leukocytosis 07/11/2016  . Chronic respiratory failure (HCC)   . Diabetes mellitus with complication (HCC)   . Hypoxemia 03/26/2016  . Altered mental status   . Sepsis (HCC) 11/27/2015  . Alzheimer disease 11/27/2015  . COPD (chronic obstructive pulmonary  disease) (HCC) 11/27/2015  . Pulmonary fibrosis (HCC) 11/27/2015  . Essential hypertension 11/27/2015  . Type 2 diabetes mellitus without complication, without long-term current use of insulin (HCC) 11/27/2015  . Elevated troponin 11/27/2015  . AKI (acute kidney injury) (HCC) 11/27/2015  . HCAP (healthcare-associated pneumonia) 11/27/2015  . Severe protein-calorie malnutrition (HCC) 11/27/2015  . Pressure ulcer 11/27/2015    Past Surgical History:  Procedure Laterality Date  . CATARACT EXTRACTION         Home Medications    Prior to Admission medications   Medication Sig Start Date End Date Taking? Authorizing Provider  acetaminophen (TYLENOL) 325 MG tablet Take 650 mg by mouth every 6 (six) hours as needed for fever.    Historical Provider, MD  amLODipine (NORVASC) 5 MG tablet Take 1 tablet (5 mg total) by mouth daily. 11/30/15   Rolly Salter, MD  aspirin EC 81 MG tablet Take 1 tablet (81 mg total) by mouth daily. 11/30/15   Rolly Salter, MD  atorvastatin (LIPITOR) 40 MG tablet Take 40 mg by mouth every morning.    Historical Provider, MD  cefUROXime (CEFTIN) 500 MG tablet Take 1 tablet (500 mg total) by mouth 2 (two) times daily with a meal. 07/15/16   Leroy Sea, MD  cetirizine (ZYRTEC) 10 MG tablet Take 10 mg by mouth every morning.    Historical Provider, MD  cholecalciferol (VITAMIN D) 1000 units tablet Take 1,000 Units by mouth daily.    Historical Provider,  MD  dextromethorphan-guaiFENesin (ROBITUSSIN-DM) 10-100 MG/5ML liquid Take 10 mLs by mouth every 6 (six) hours as needed for cough.    Historical Provider, MD  diclofenac sodium (VOLTAREN) 1 % GEL Apply 1 g topically 2 (two) times daily.    Historical Provider, MD  feeding supplement, GLUCERNA SHAKE, (GLUCERNA SHAKE) LIQD Take 237 mLs by mouth every evening. Strawberry    Historical Provider, MD  gabapentin (NEURONTIN) 100 MG capsule Take 100 mg by mouth 3 (three) times daily.    Historical Provider, MD  glucagon  (GLUCAGON EMERGENCY) 1 MG injection Inject 1 mg into the vein once as needed (for hypoglycemia (BS <60 mg/dl) if patient unable to swallow or unconscious).    Historical Provider, MD  guaiFENesin (MUCINEX) 600 MG 12 hr tablet Take 1 tablet (600 mg total) by mouth 2 (two) times daily. 11/30/15   Rolly SalterPranav M Patel, MD  insulin aspart (NOVOLOG FLEXPEN) 100 UNIT/ML FlexPen Inject 0-8 Units into the skin 4 (four) times daily -  before meals and at bedtime. 201 - 250 = 2 units 251 - 300 = 4 units 301 - 350 = 6 units 351 - 400 = 8 units If BS < 60, call MD If BS > 400, give 10 units and call MD     Historical Provider, MD  ipratropium-albuterol (DUONEB) 0.5-2.5 (3) MG/3ML SOLN Take 3 mLs by nebulization every 6 (six) hours. 3 ml inhale orally every 6 hours related to COPD exacerbation.  Rinse mouth with water.  Do not swallow.    Historical Provider, MD  metFORMIN (GLUCOPHAGE) 500 MG tablet Take 500-1,000 mg by mouth 2 (two) times daily with a meal. Take 2 tablets in the morning and 2 tables in the evening.    Historical Provider, MD  metoprolol tartrate (LOPRESSOR) 25 MG tablet Take 1 tablet (25 mg total) by mouth 2 (two) times daily. 11/30/15   Rolly SalterPranav M Patel, MD  mirtazapine (REMERON) 15 MG tablet Take 15 mg by mouth at bedtime.    Historical Provider, MD  mometasone-formoterol (DULERA) 200-5 MCG/ACT AERO Inhale 2 puffs into the lungs 2 (two) times daily.    Historical Provider, MD  Multiple Vitamin (MULTIVITAMIN) tablet Take 1 tablet by mouth every morning.    Historical Provider, MD  OXYGEN Inhale 2 L into the lungs continuous.    Historical Provider, MD  predniSONE (DELTASONE) 5 MG tablet Label  & dispense according to the schedule below. 10 Pills PO for 3 days then, 8 Pills PO for 3 days, 6 Pills PO for 3 days, 4 Pills PO for 3 days, 2 Pills PO for 3 days, then continue taking 1 pill of 5 mg daily as before. 07/15/16   Leroy SeaPrashant K Singh, MD  predniSONE (DELTASONE) 5 MG tablet Take 1 tablet (5 mg total) by  mouth daily with breakfast. Resume after high-dose prednisone taper has come down to 5 mg daily 07/26/16   Leroy SeaPrashant K Singh, MD  sennosides-docusate sodium (SENOKOT-S) 8.6-50 MG tablet Take 1 tablet by mouth 2 (two) times daily.    Historical Provider, MD  sorbitol 70 % solution Take 30 mLs by mouth daily as needed (for constipation).    Historical Provider, MD    Family History History reviewed. No pertinent family history.  Social History Social History  Substance Use Topics  . Smoking status: Former Smoker    Types: Cigarettes    Quit date: 07/27/2005  . Smokeless tobacco: Never Used  . Alcohol use No     Allergies  Patient has no known allergies.   Review of Systems Review of Systems  Unable to perform ROS: Intubated     Physical Exam Updated Vital Signs BP (!) 32/18   Pulse 65   Resp (!) 0   Wt 45.4 kg   SpO2 (!) 64%   BMI 19.53 kg/m   Physical Exam  Constitutional: He appears cachectic. He is intubated.  Appears chronically ill  HENT:  Head: Normocephalic and atraumatic.  Eyes:  Pupils 5 mm, round, non-reactive  Cardiovascular: Bradycardia present.   Pulses:      Femoral pulses are 2+ on the right side, and 2+ on the left side. Pulmonary/Chest: Breath sounds normal. He is intubated.  B/l equal breath sounds  Abdominal: Soft. He exhibits no distension.  Musculoskeletal:  IO right tibia  Neurological: He is unresponsive. GCS eye subscore is 1. GCS verbal subscore is 1. GCS motor subscore is 1.  Skin: Capillary refill takes more than 3 seconds. There is pallor.  Nursing note and vitals reviewed.    ED Treatments / Results  Labs (all labs ordered are listed, but only abnormal results are displayed) Labs Reviewed  CBC WITH DIFFERENTIAL/PLATELET - Abnormal; Notable for the following:       Result Value   WBC 13.7 (*)    RBC 3.88 (*)    Hemoglobin 10.4 (*)    HCT 35.7 (*)    MCHC 29.1 (*)    Platelets 109 (*)    Neutro Abs 7.8 (*)    Lymphs Abs  5.4 (*)    All other components within normal limits  COMPREHENSIVE METABOLIC PANEL - Abnormal; Notable for the following:    Potassium 6.4 (*)    CO2 21 (*)    Glucose, Bld 122 (*)    BUN 46 (*)    Creatinine, Ser 1.53 (*)    Calcium 11.2 (*)    Total Protein 4.8 (*)    Albumin 2.0 (*)    AST 2,128 (*)    ALT 1,397 (*)    Alkaline Phosphatase 160 (*)    GFR calc non Af Amer 41 (*)    GFR calc Af Amer 48 (*)    Anion gap 21 (*)    All other components within normal limits  I-STAT CG4 LACTIC ACID, ED - Abnormal; Notable for the following:    Lactic Acid, Venous 16.65 (*)    All other components within normal limits  I-STAT TROPOININ, ED - Abnormal; Notable for the following:    Troponin i, poc 0.11 (*)    All other components within normal limits  I-STAT ARTERIAL BLOOD GAS, ED - Abnormal; Notable for the following:    pH, Arterial 7.115 (*)    pCO2 arterial 60.7 (*)    pO2, Arterial 325.0 (*)    Bicarbonate 19.5 (*)    Acid-base deficit 10.0 (*)    All other components within normal limits  CBG MONITORING, ED - Abnormal; Notable for the following:    Glucose-Capillary 191 (*)    All other components within normal limits  CULTURE, BLOOD (ROUTINE X 2)  CULTURE, BLOOD (ROUTINE X 2)  I-STAT CG4 LACTIC ACID, ED    EKG  EKG Interpretation  Date/Time:  Thursday 10-Sep-2016 21:53:14 EDT Ventricular Rate:  0 PR Interval:    QRS Duration: 127 QT Interval:  415 QTC Calculation: 442 R Axis:   0 Text Interpretation:  Uncertain rhythm: review No further analysis attempted - not enough leads could be measured  Confirmed by [facility] MD, Barbara Cower [phone]) on 2016/08/29 10:14:48 PM       Radiology Dg Chest Portable 1 View  Result Date: Aug 29, 2016 CLINICAL DATA:  82 year old male with cardiac arrest. Status post intubation. EXAM: PORTABLE CHEST 1 VIEW COMPARISON:  Chest radiograph dated 07/15/2016 FINDINGS: An endotracheal tube is noted with tip approximately 3.9 cm above the carina.  There is emphysematous changes of the lungs with diffuse interstitial coarsening likely related to underlying interstitial lung disease. Superimposed pneumonia is not excluded. Clinical correlation is recommended. There is no focal consolidation, pleural effusion, or pneumothorax. There is mild cardiomegaly. Osteopenia with degenerative changes of the spine and shoulders. No acute osseous pathology identified. There is gaseous distention of the stomach. IMPRESSION: 1. Endotracheal tube above the carina. 2. Diffuse interstitial coarsening likely combination of emphysema and underlying interstitial lung disease. Pneumonia is not excluded. Clinical correlation is recommended. No pneumothorax. Electronically Signed   By: Elgie Collard M.D.   On: 2016-08-29 20:42    Procedures Procedures (including critical care time)  Medications Ordered in ED Medications  0.9 %  sodium chloride infusion ( Intravenous Stopped 08/29/16 10/22/12)  EPINEPHrine (ADRENALIN) 4 mg in dextrose 5 % 250 mL (0.016 mg/mL) infusion (0 mcg/min Intravenous Stopped 2016/08/29 2149/10/22)  phenylephrine (NEO-SYNEPHRINE) 10 mg in dextrose 5 % 250 mL (0.04 mg/mL) infusion (not administered)  EPINEPHrine (ADRENALIN) 1 MG/10ML injection (1 mg Intravenous Given 08-29-16 2015)  calcium chloride injection (1 g Intravenous Given 08-29-16 2015)  sodium bicarbonate injection (100 mEq Intravenous Given 08-29-2016 2015)  calcium gluconate 1 g in sodium chloride 0.9 % 100 mL IVPB (0 g Intravenous Stopped 29-Aug-2016 22-Oct-2149)     Initial Impression / Assessment and Plan / ED Course  I have reviewed the triage vital signs and the nursing notes.  Pertinent labs & imaging results that were available during my care of the patient were reviewed by me and considered in my medical decision making (see chart for details).    82 y.o. male presents unresponsive and intubated s/p cardiac arrest. On arrival, sinus rhythm, normotensive with b/l equal breath sounds. CBG normal.  Pupils fixed and dilated. Code STEMI initiated based off EKG transmitted PTA, however canceled after cardiology evaluation on arrival. ETT placement confirmed via CXR, no PTX or widened mediastinum. Approx 10 minutes after arrival, patient lost pulses again. Over the next 30-40 minutes patient had several cardiac arrests despite epinephrine, calcium and bicarb gtt as well as IVF. Unable to capture with transcutaneous pacing. EKG on ROSC sinus bradycardia w/o peaked T waves or widened QRS. Family arrived and after lengthy discussion, agreed to make patient DNR. Time of death 10/22/2148.   Discussed with my attending physician, Dr Clayborne Dana  Final Clinical Impressions(s) / ED Diagnoses   Final diagnoses:  Unresponsive  Cardiac arrest [facility])  Lactic acidosis    New Prescriptions Discharge Medication List as of 2016/08/29 11:28 PM       Pablo Ledger, MD 08/17/16 0150    Marily Memos, MD 08/17/16 1110

## 2016-09-02 NOTE — ED Notes (Signed)
QNS blood draw,  Phleb Kayla will get labs.

## 2016-09-02 NOTE — ED Notes (Signed)
Compressions stopped

## 2016-09-02 NOTE — Progress Notes (Signed)
CRITICAL ABG VALUES GIVEN TO MD

## 2016-09-02 NOTE — Progress Notes (Signed)
   08/03/16 2120  Clinical Encounter Type  Visited With Patient and family together  Visit Type Patient actively dying  Spiritual Encounters  Spiritual Needs Prayer;Emotional;Grief support  Stress Factors  Patient Stress Factors Major life changes  Family Stress Factors Family relationships;Health changes  Introduction to family and offered prayer of comfort and peace. Offered emotional support and ministry of presence. Most family present as Pt departed. Gave RC prayer for the departure of the soul. Provided Patient Placement card to family.

## 2016-09-02 NOTE — ED Notes (Signed)
Chaplain at bedside to comfort family members; tissue provided

## 2016-09-02 NOTE — ED Notes (Addendum)
Family members present at bedside- permitting medications but no compressions; awaiting one other family member

## 2016-09-02 NOTE — ED Notes (Signed)
Pt presents from unknown named SNF via [facility] for CPR/post-CPR; pt was last noted to be alert at dinner at 1820; EMS was called at 1850; EMS noted patient to be pulseless and CPR intitiated; EMS gave total of 1 shock, 5 rounds of epi, 1g calcium, 1 amp bicarb, 450mg  amiodarone; pts regained pulses at 70bpm then bradied and EMS began pacing at 60 bpm; pt arrived with 7.0ETT in place, I/O in L tibia, and lucas device in place (not being used at the time of arrival)

## 2016-09-02 NOTE — Progress Notes (Signed)
  ABG COLLECTED  

## 2016-09-02 DEATH — deceased

## 2017-08-03 IMAGING — CT CT HEAD W/O CM
4 series · 19 of 47 positions shown, 21 images · non-contrast
Comparison: None.

CLINICAL DATA: Acute mental status changes.

EXAM:
CT HEAD WITHOUT CONTRAST
TECHNIQUE: Contiguous axial images were obtained from the base of the skull
through the vertex without intravenous contrast.

[Series 201: head w/o, idose (1) · axial · non-contrast · 0.49mm/px · z∈[+110,+230]mm · 8 of 32 slices shown, 10 images]
[im 4/32  brain]
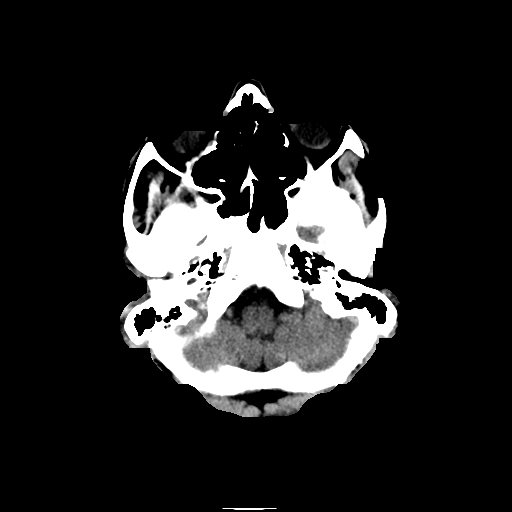
[im 4/32  bone]
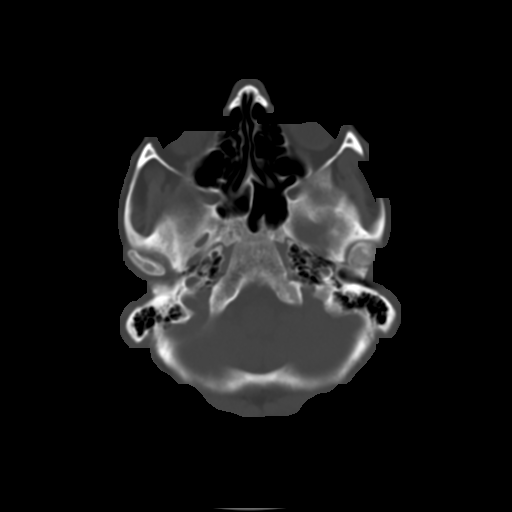
[im 7/32  brain]
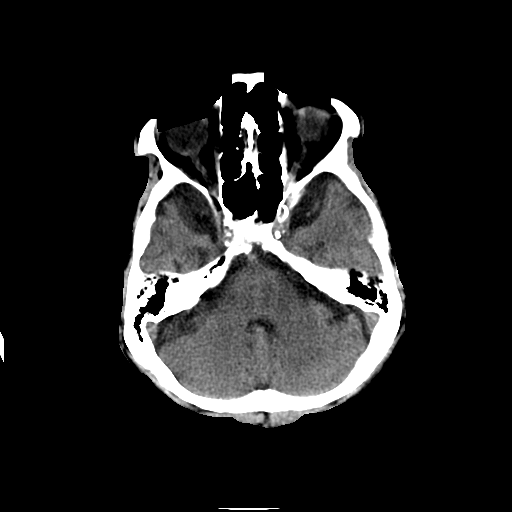
[im 11/32  brain]
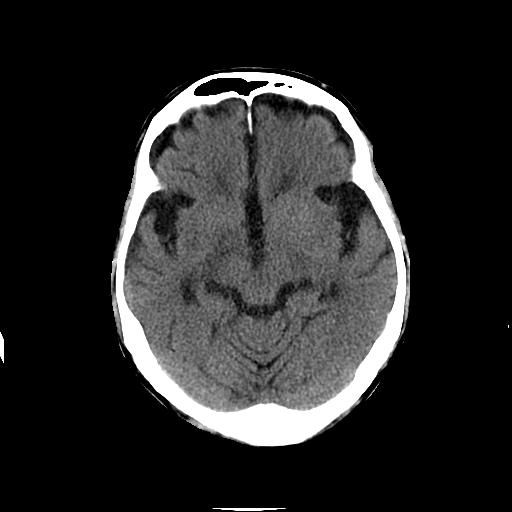
[im 14/32  brain]
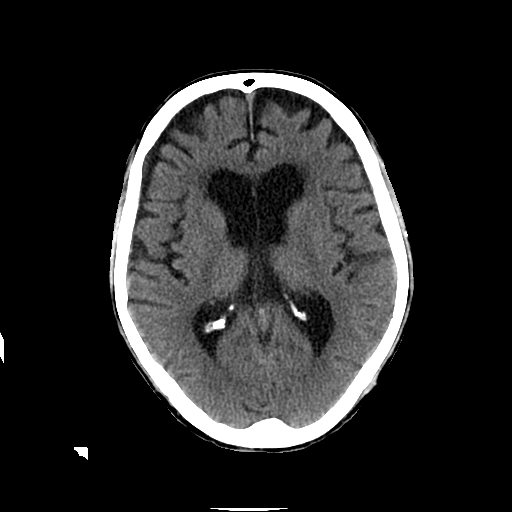
[im 18/32  brain]
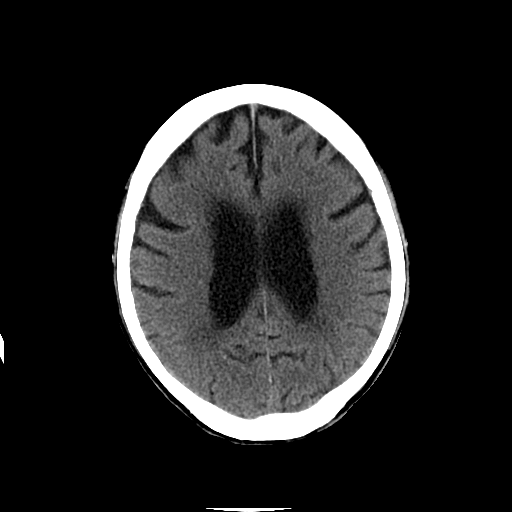
[im 18/32  bone]
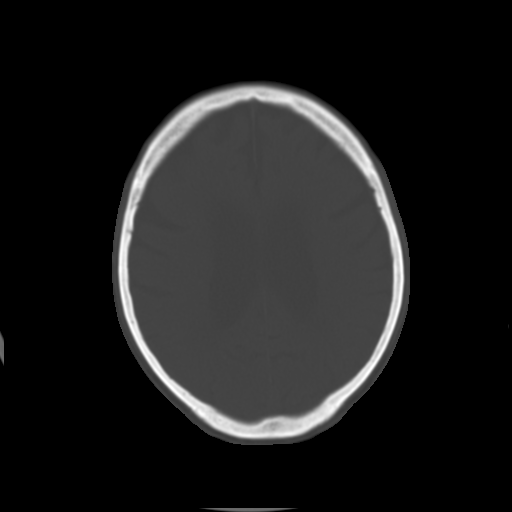
[im 21/32  brain]
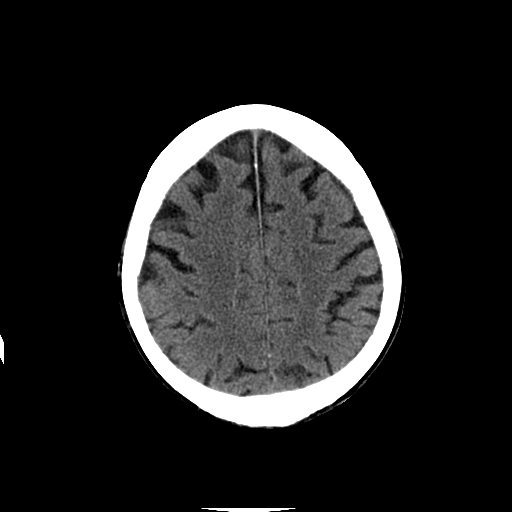
[im 25/32  brain]
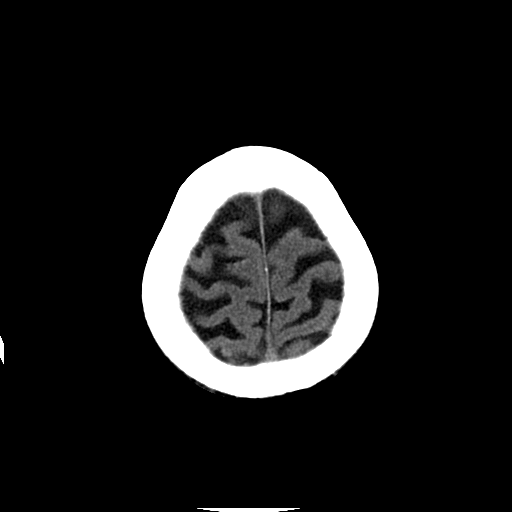
[im 28/32  brain]
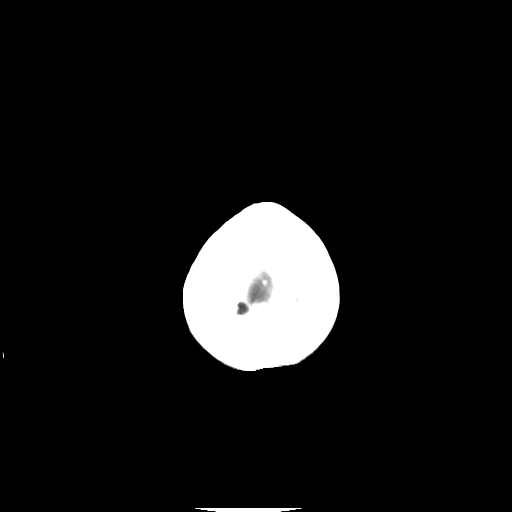

[Series 202: coronals, idose (1) · coronal · 0.40mm/px · 3 of 70 slices shown]
[im 24/70  brain]
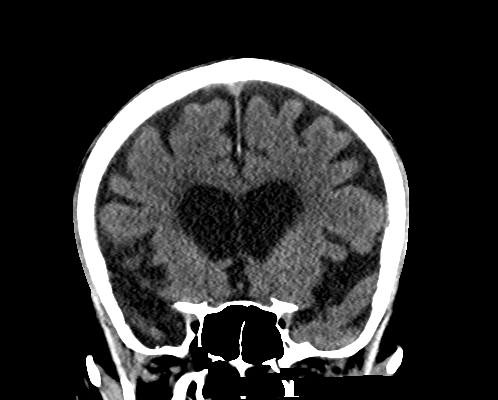
[im 31/70  brain]
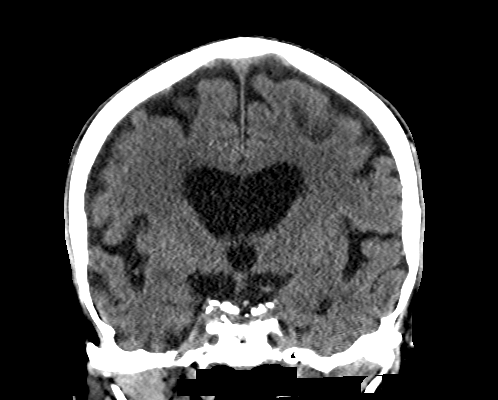
[im 39/70  brain]
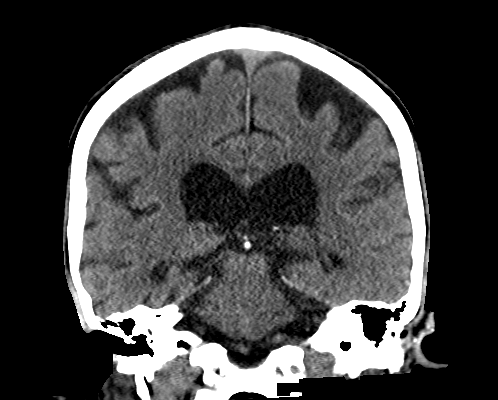

[Series 203: sagittals, idose (1) · sagittal · 0.40mm/px · 3 of 65 slices shown]
[im 22/65  brain]
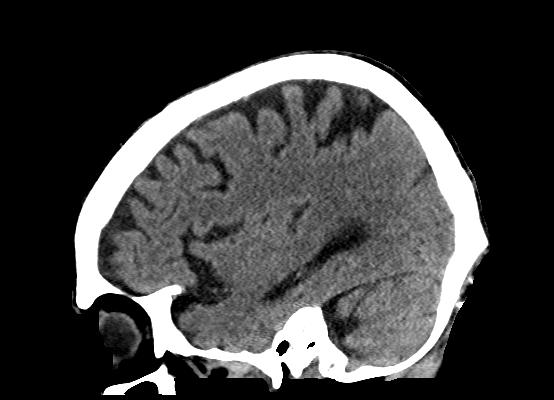
[im 33/65  brain]
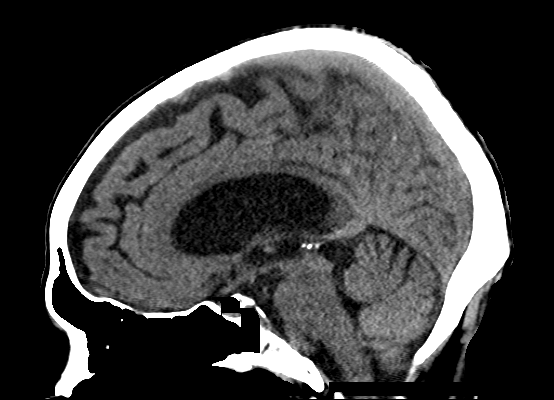
[im 43/65  brain]
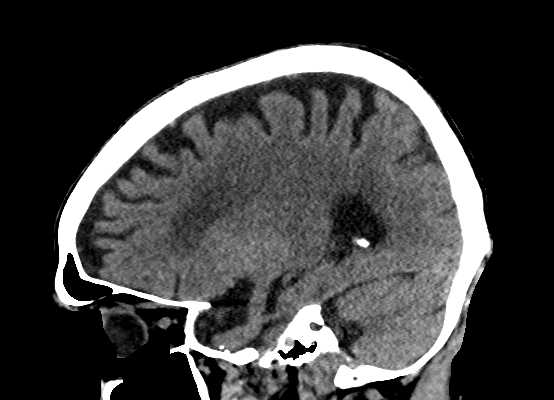

[Series 204: head w/o bone, idose (1) · axial · non-contrast · 0.43mm/px · z∈[+113,+188]mm · 5 of 64 slices shown]
[im 7/64  bone]
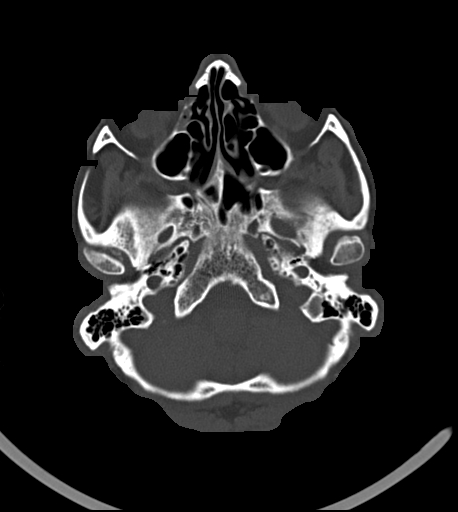
[im 14/64  bone]
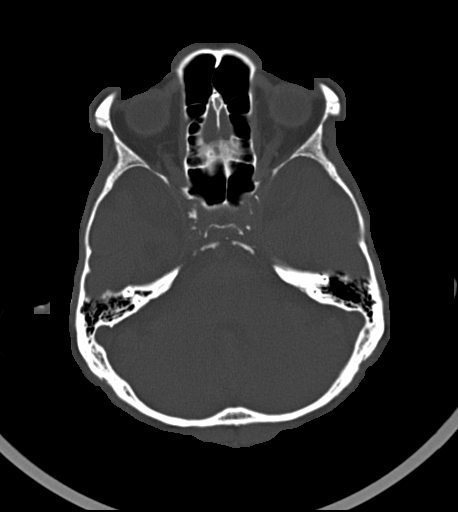
[im 20/64  bone]
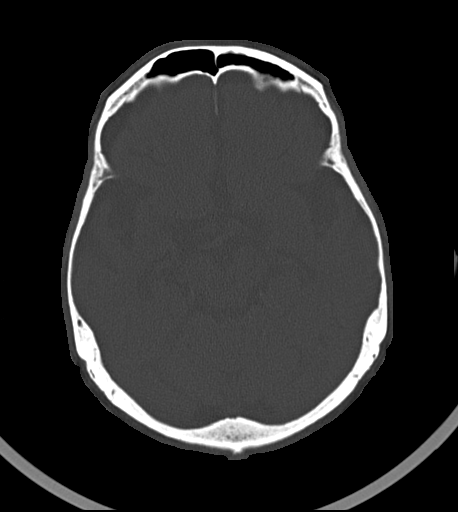
[im 27/64  bone]
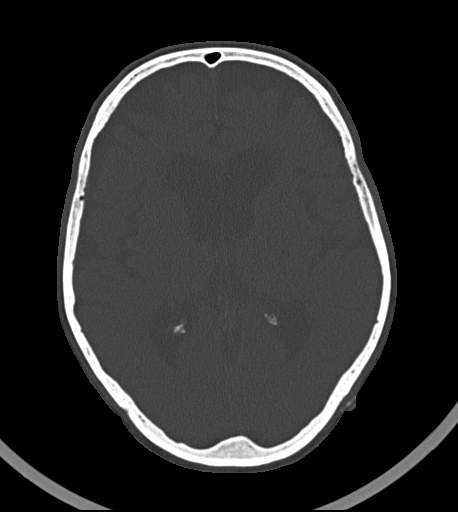
[im 37/64  bone]
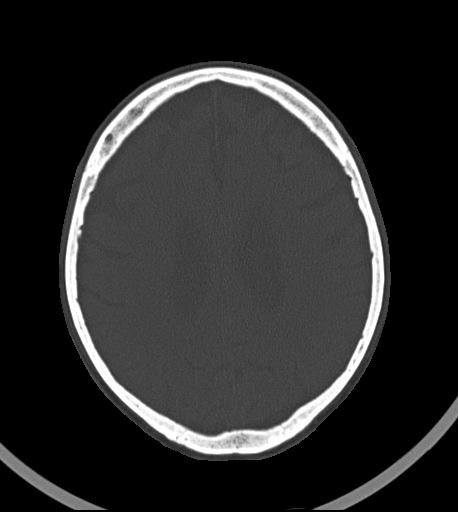

[19 of 47 positions shown; findings below may reference images not displayed]

FINDINGS: Age related cerebral atrophy, ventriculomegaly and periventricular
white matter disease. No acute intracranial findings such as
hemispheric infarction an or intracranial hemorrhage. No mass
lesions are identified. No extra-axial fluid collections. The
brainstem and cerebellum are grossly normal.

The bony structures are intact. The paranasal sinuses and mastoid
air cells are clear. The globes are intact.
IMPRESSION: Cerebral atrophy, ventriculomegaly and periventricular white matter
disease but no acute intracranial findings or mass lesions.
